# Patient Record
Sex: Male | Born: 1965 | Race: Black or African American | Hispanic: No | Marital: Single | Smoking: Current every day smoker
Health system: Southern US, Community
[De-identification: ages and names within clinical notes are randomized; demographics above are authoritative.]

## PROBLEM LIST (undated history)

## (undated) DIAGNOSIS — F1010 Alcohol abuse, uncomplicated: Secondary | ICD-10-CM

## (undated) DIAGNOSIS — I10 Essential (primary) hypertension: Secondary | ICD-10-CM

## (undated) DIAGNOSIS — I2510 Atherosclerotic heart disease of native coronary artery without angina pectoris: Secondary | ICD-10-CM

## (undated) DIAGNOSIS — I5042 Chronic combined systolic (congestive) and diastolic (congestive) heart failure: Secondary | ICD-10-CM

## (undated) DIAGNOSIS — F1490 Cocaine use, unspecified, uncomplicated: Secondary | ICD-10-CM

## (undated) HISTORY — DX: Essential (primary) hypertension: I10

## (undated) HISTORY — PX: EAR CYST EXCISION: SHX22

---

## 2016-07-21 ENCOUNTER — Encounter: Payer: Self-pay | Admitting: *Deleted

## 2016-07-21 ENCOUNTER — Other Ambulatory Visit: Payer: Self-pay | Admitting: *Deleted

## 2016-07-22 ENCOUNTER — Ambulatory Visit: Payer: Self-pay | Admitting: Cardiology

## 2016-08-12 ENCOUNTER — Encounter: Payer: Self-pay | Admitting: *Deleted

## 2016-08-12 ENCOUNTER — Ambulatory Visit (INDEPENDENT_AMBULATORY_CARE_PROVIDER_SITE_OTHER): Payer: Self-pay | Admitting: Cardiology

## 2016-08-12 ENCOUNTER — Encounter: Payer: Self-pay | Admitting: Cardiology

## 2016-08-12 VITALS — BP 136/80 | HR 73 | Ht 67.0 in | Wt 197.2 lb

## 2016-08-12 DIAGNOSIS — I429 Cardiomyopathy, unspecified: Secondary | ICD-10-CM

## 2016-08-12 DIAGNOSIS — I5022 Chronic systolic (congestive) heart failure: Secondary | ICD-10-CM

## 2016-08-12 NOTE — Progress Notes (Signed)
Clinical Summary Mr. Vlasic is a 50 y.o.male seen today as a new patient. He is referred by Dr Amanda Pea. He is a former patient of [place] [facility]   1. Chronic systolic HF - Q000111Q echo LVEF 25-30%, abnormal diastolic function - Q000111Q Lexiscan: inferobasilar to mid inferior gut attenuation vs inferior infarct, no ischemia.  - from Dr Dion Body notes plan was for trial of medical therapy, if LVEF not improved plan for cath at that time. - compliant with meds. Does not weight at home, though reports recent weight gain. From 04/2016 appt with Dr Hamilton Capri he is up 14 lbs.  - walks 2-4 miles 3 days a week without troubles.   - no orthopnea, no PND.  CHF risk factors: father with history of CHF, brother with "weak heart", reports previous heavy EtOH abuse but has now stopped, previous crack use but also stopped     Past Medical History:  Diagnosis Date  . CHF (congestive heart failure) ([place])   . Hypertension      No Known Allergies   Current Outpatient Prescriptions  Medication Sig Dispense Refill  . albuterol (PROVENTIL) (2.5 MG/3ML) 0.083% nebulizer solution Inhale 3 mLs into the lungs every 6 (six) hours as needed.    Marland Kitchen aspirin 81 MG chewable tablet Chew 1 tablet by mouth daily.    . carvedilol (COREG) 25 MG tablet Take 1 tablet by mouth 2 (two) times daily.    . furosemide (LASIX) 40 MG tablet Take 1 tablet by mouth daily.    . isosorbide mononitrate (IMDUR) 60 MG 24 hr tablet Take 1 tablet by mouth daily.    Marland Kitchen lisinopril (PRINIVIL,ZESTRIL) 40 MG tablet Take 1 tablet by mouth daily.    . potassium chloride SA (K-DUR,KLOR-CON) 20 MEQ tablet Take 1 tablet by mouth daily.     No current facility-administered medications for this visit.      No past surgical history on file.   No Known Allergies    Family History Father and brother with history of heart failure.    Social History Mr. Seaberry reports that he has been smoking.  He has never used  smokeless tobacco. Mr. Dearment reports previous heavy EtOH use   Review of Systems CONSTITUTIONAL: No weight loss, fever, chills, weakness or fatigue.  HEENT: Eyes: No visual loss, blurred vision, double vision or yellow sclerae.No hearing loss, sneezing, congestion, runny nose or sore throat.  SKIN: No rash or itching.  CARDIOVASCULAR: per HPI  RESPIRATORY: No shortness of breath, cough or sputum.  GASTROINTESTINAL: No anorexia, nausea, vomiting or diarrhea. No abdominal pain or blood.  GENITOURINARY: No burning on urination, no polyuria NEUROLOGICAL: No headache, dizziness, syncope, paralysis, ataxia, numbness or tingling in the extremities. No change in bowel or bladder control.  MUSCULOSKELETAL: No muscle, back pain, joint pain or stiffness.  LYMPHATICS: No enlarged nodes. No history of splenectomy.  PSYCHIATRIC: No history of depression or anxiety.  ENDOCRINOLOGIC: No reports of sweating, cold or heat intolerance. No polyuria or polydipsia.  Marland Kitchen   Physical Examination Vitals:   08/12/16 1335 08/12/16 1338  BP: 128/75 136/80  Pulse:  73   Filed Weights   08/12/16 1335  Weight: 197 lb 3.2 oz (89.4 kg)    Gen: resting comfortably, no acute distress HEENT: no scleral icterus, pupils equal round and reactive, no palptable cervical adenopathy,  CV: RRR, no m/r/g, no jvd Resp: Clear to auscultation bilaterally GI: abdomen is soft, non-tender, non-distended, normal bowel sounds,  no hepatosplenomegaly MSK: extremities are warm, no edema.  Skin: warm, no rash Neuro:  no focal deficits Psych: appropriate affect     Assessment and Plan  1. Chronic systolic HF - LVEF 123XX123, NYHA I - will repeat echo, if LVEF still low will require LHC/RHC - continue current meds. If continued low LVEF likely change to entresto, consider adding aldactone in the future - EKG in clinic shows SR  F/u pending echo results. Request pcp labs.       Arnoldo Lenis, M.D.

## 2016-08-12 NOTE — Patient Instructions (Signed)
Your physician recommends that you schedule a follow-up appointment PENDING TEST RESULTS   Your physician recommends that you continue on your current medications as directed. Please refer to the Current Medication list given to you today.  Your physician has requested that you have an echocardiogram. Echocardiography is a painless test that uses sound waves to create images of your heart. It provides your doctor with information about the size and shape of your heart and how well your heart's chambers and valves are working. This procedure takes approximately one hour. There are no restrictions for this procedure.  Thank you for choosing [facility]!!

## 2016-08-28 ENCOUNTER — Other Ambulatory Visit: Payer: Self-pay

## 2016-08-28 ENCOUNTER — Ambulatory Visit (INDEPENDENT_AMBULATORY_CARE_PROVIDER_SITE_OTHER): Payer: Self-pay

## 2016-08-28 DIAGNOSIS — I5022 Chronic systolic (congestive) heart failure: Secondary | ICD-10-CM

## 2016-09-02 ENCOUNTER — Telehealth: Payer: Self-pay | Admitting: *Deleted

## 2016-09-02 NOTE — Telephone Encounter (Signed)
Notes Recorded by Laurine Blazer, LPN on [id number] at 579FGE AM EDT Patient notified and verbalized understanding. Copy to pmd. Follow up scheduled for 10/14/2016 with Dr. Harl Bowie. ------  Notes Recorded by Arnoldo Lenis, MD on 08/29/2016 at 4:39 PM EDT Heart function has normalized, which is great news. He should see me back in 6 weeks to discuss in detail. NO further med changes indicated at this time

## 2016-10-14 ENCOUNTER — Encounter: Payer: Self-pay | Admitting: *Deleted

## 2016-10-14 ENCOUNTER — Encounter: Payer: Self-pay | Admitting: Cardiology

## 2016-10-14 ENCOUNTER — Ambulatory Visit (INDEPENDENT_AMBULATORY_CARE_PROVIDER_SITE_OTHER): Payer: Self-pay | Admitting: Cardiology

## 2016-10-14 VITALS — BP 161/93 | HR 65 | Ht 67.0 in | Wt 208.0 lb

## 2016-10-14 DIAGNOSIS — I5022 Chronic systolic (congestive) heart failure: Secondary | ICD-10-CM

## 2016-10-14 DIAGNOSIS — I10 Essential (primary) hypertension: Secondary | ICD-10-CM

## 2016-10-14 NOTE — Patient Instructions (Signed)

## 2016-10-14 NOTE — Progress Notes (Signed)
Clinical Summary Mr. Moskwa is a 50 y.o.male seen today for follow up of the following medical problems.   1. Chronic systolic HF - Q000111Q echo LVEF 25-30%, abnormal diastolic function - Q000111Q Lexiscan: inferobasilar to mid inferior gut attenuation vs inferior infarct, no ischemia.  - from Dr Dion Body notes plan was for trial of medical therapy, if LVEF not improved plan for cath at that time. - compliant with meds. Does not weight at home, though reports recent weight gain. From 04/2016 appt with Dr Hamilton Capri he is up 14 lbs.  - walks 2-4 miles 3 days a week without troubles.   - no orthopnea, no PND.  CHF risk factors: father with history of CHF, brother with "weak heart", reports previous heavy EtOH abuse but has now stopped, previous crack use but also stopped  - since last visit repeated echo 08/2016, LVEF at Q000111Q, grade I diastolic dysfunction.  - SOB a few days ago no resolved. No LE edema.   2. HTN - just took a meds prior to coming to clinic. Compliant with meds   Past Medical History:  Diagnosis Date  . CHF (congestive heart failure) ([place])   . Hypertension      No Known Allergies   Current Outpatient Prescriptions  Medication Sig Dispense Refill  . albuterol (PROVENTIL) (2.5 MG/3ML) 0.083% nebulizer solution Inhale 3 mLs into the lungs every 6 (six) hours as needed.    Marland Kitchen aspirin 81 MG chewable tablet Chew 1 tablet by mouth daily.    . carvedilol (COREG) 25 MG tablet Take 1 tablet by mouth 2 (two) times daily.    . furosemide (LASIX) 40 MG tablet Take 1 tablet by mouth daily.    . isosorbide mononitrate (IMDUR) 60 MG 24 hr tablet Take 1 tablet by mouth daily.    Marland Kitchen lisinopril (PRINIVIL,ZESTRIL) 40 MG tablet Take 1 tablet by mouth daily.    . potassium chloride SA (K-DUR,KLOR-CON) 20 MEQ tablet Take 1 tablet by mouth daily.     No current facility-administered medications for this visit.      No past surgical history on file.   No Known  Allergies    No family history on file.   Social History Mr. Bluemel reports that he has been smoking.  He has never used smokeless tobacco. Mr. Irons has no alcohol history on file.   Review of Systems CONSTITUTIONAL: No weight loss, fever, chills, weakness or fatigue.  HEENT: Eyes: No visual loss, blurred vision, double vision or yellow sclerae.No hearing loss, sneezing, congestion, runny nose or sore throat.  SKIN: No rash or itching.  CARDIOVASCULAR: per HPI RESPIRATORY: No shortness of breath, cough or sputum.  GASTROINTESTINAl: No anorexia, nausea, vomiting or diarrhea. No abdominal pain or blood.  GENITOURINARY: No burning on urination, no polyuria NEUROLOGICAL: No headache, dizziness, syncope, paralysis, ataxia, numbness or tingling in the extremities. No change in bowel or bladder control.  MUSCULOSKELETAL: No muscle, back pain, joint pain or stiffness.  LYMPHATICS: No enlarged nodes. No history of splenectomy.  PSYCHIATRIC: No history of depression or anxiety.  ENDOCRINOLOGIC: No reports of sweating, cold or heat intolerance. No polyuria or polydipsia.  Marland Kitchen   Physical Examination Vitals:   10/14/16 0858  BP: (!) 161/93  Pulse: 65   Vitals:   10/14/16 0858  Weight: 208 lb (94.3 kg)  Height: 5\' 7"  (1.702 m)    Gen: resting comfortably, no acute distress HEENT: no scleral icterus, pupils equal round and reactive, no palptable cervical  adenopathy,  CV: RRR, no m/r/g, no jvd Resp: Clear to auscultation bilaterally GI: abdomen is soft, non-tender, non-distended, normal bowel sounds, no hepatosplenomegaly MSK: extremities are warm, no edema.  Skin: warm, no rash Neuro:  no focal deficits Psych: appropriate affect   Diagnostic Studies 08/2016 echo Study Conclusions  - Left ventricle: The cavity size was normal. Wall thickness was   increased in a pattern of moderate LVH. Systolic function was   vigorous. The estimated ejection fraction was in the range of  65%   to 70%. Wall motion was normal; there were no regional wall   motion abnormalities. Doppler parameters are consistent with   abnormal left ventricular relaxation (grade 1 diastolic   dysfunction). - Aortic valve: Valve area (VTI): 3.2 cm^2. Valve area (Vmax): 2.89   cm^2. Valve area (Vmean): 2.95 cm^2. - Technically adequate study.    Assessment and Plan  1. Chronic systolic HF - LVEF 123XX123, NYHA I. Repeat echo shows LVEF has now normalized, LVEF 65-70% -no recent symptoms, continue current meds at this time  2. HTN - elevated in clinic, he has just taken his meds prior to coming. BP at last visit on current meds 136/80 - continue to monitor at this time.   F/u 6 months. Give list of local pcp's       Arnoldo Lenis, M.D.

## 2018-09-27 ENCOUNTER — Emergency Department (HOSPITAL_COMMUNITY): Payer: Medicare Other

## 2018-09-27 ENCOUNTER — Other Ambulatory Visit: Payer: Self-pay

## 2018-09-27 ENCOUNTER — Encounter (HOSPITAL_COMMUNITY): Payer: Self-pay

## 2018-09-27 ENCOUNTER — Inpatient Hospital Stay (HOSPITAL_COMMUNITY)
Admission: EM | Admit: 2018-09-27 | Discharge: 2018-09-29 | DRG: 281 | Disposition: A | Discharge status: Home / Self Care | Payer: Medicare Other | Attending: Internal Medicine | Admitting: Internal Medicine

## 2018-09-27 DIAGNOSIS — I5030 Unspecified diastolic (congestive) heart failure: Secondary | ICD-10-CM | POA: Diagnosis not present

## 2018-09-27 DIAGNOSIS — F1290 Cannabis use, unspecified, uncomplicated: Secondary | ICD-10-CM | POA: Diagnosis present

## 2018-09-27 DIAGNOSIS — Z720 Tobacco use: Secondary | ICD-10-CM | POA: Diagnosis not present

## 2018-09-27 DIAGNOSIS — I110 Hypertensive heart disease with heart failure: Secondary | ICD-10-CM | POA: Diagnosis present

## 2018-09-27 DIAGNOSIS — R7989 Other specified abnormal findings of blood chemistry: Secondary | ICD-10-CM

## 2018-09-27 DIAGNOSIS — I5042 Chronic combined systolic (congestive) and diastolic (congestive) heart failure: Secondary | ICD-10-CM | POA: Diagnosis present

## 2018-09-27 DIAGNOSIS — R079 Chest pain, unspecified: Secondary | ICD-10-CM

## 2018-09-27 DIAGNOSIS — Z79899 Other long term (current) drug therapy: Secondary | ICD-10-CM | POA: Diagnosis not present

## 2018-09-27 DIAGNOSIS — I213 ST elevation (STEMI) myocardial infarction of unspecified site: Secondary | ICD-10-CM | POA: Diagnosis not present

## 2018-09-27 DIAGNOSIS — I10 Essential (primary) hypertension: Secondary | ICD-10-CM | POA: Diagnosis present

## 2018-09-27 DIAGNOSIS — F17210 Nicotine dependence, cigarettes, uncomplicated: Secondary | ICD-10-CM | POA: Diagnosis not present

## 2018-09-27 DIAGNOSIS — R778 Other specified abnormalities of plasma proteins: Secondary | ICD-10-CM

## 2018-09-27 DIAGNOSIS — R0789 Other chest pain: Secondary | ICD-10-CM | POA: Diagnosis not present

## 2018-09-27 DIAGNOSIS — I509 Heart failure, unspecified: Secondary | ICD-10-CM | POA: Diagnosis not present

## 2018-09-27 DIAGNOSIS — I25111 Atherosclerotic heart disease of native coronary artery with angina pectoris with documented spasm: Secondary | ICD-10-CM | POA: Diagnosis present

## 2018-09-27 DIAGNOSIS — Z8249 Family history of ischemic heart disease and other diseases of the circulatory system: Secondary | ICD-10-CM | POA: Diagnosis not present

## 2018-09-27 DIAGNOSIS — Z9119 Patient's noncompliance with other medical treatment and regimen: Secondary | ICD-10-CM

## 2018-09-27 DIAGNOSIS — F1910 Other psychoactive substance abuse, uncomplicated: Secondary | ICD-10-CM | POA: Diagnosis not present

## 2018-09-27 DIAGNOSIS — I214 Non-ST elevation (NSTEMI) myocardial infarction: Secondary | ICD-10-CM | POA: Diagnosis not present

## 2018-09-27 DIAGNOSIS — F14188 Cocaine abuse with other cocaine-induced disorder: Secondary | ICD-10-CM | POA: Diagnosis present

## 2018-09-27 DIAGNOSIS — R0689 Other abnormalities of breathing: Secondary | ICD-10-CM | POA: Diagnosis not present

## 2018-09-27 DIAGNOSIS — Z7982 Long term (current) use of aspirin: Secondary | ICD-10-CM

## 2018-09-27 DIAGNOSIS — E7800 Pure hypercholesterolemia, unspecified: Secondary | ICD-10-CM | POA: Diagnosis not present

## 2018-09-27 HISTORY — DX: Chronic combined systolic (congestive) and diastolic (congestive) heart failure: I50.42

## 2018-09-27 HISTORY — DX: Cocaine use, unspecified, uncomplicated: F14.90

## 2018-09-27 HISTORY — DX: Atherosclerotic heart disease of native coronary artery without angina pectoris: I25.10

## 2018-09-27 HISTORY — DX: Alcohol abuse, uncomplicated: F10.10

## 2018-09-27 LAB — COMPREHENSIVE METABOLIC PANEL
ALK PHOS: 43 U/L (ref 38–126)
ALT: 21 U/L (ref 0–44)
ANION GAP: 8 (ref 5–15)
AST: 24 U/L (ref 15–41)
Albumin: 3.7 g/dL (ref 3.5–5.0)
BILIRUBIN TOTAL: 1 mg/dL (ref 0.3–1.2)
BUN: 13 mg/dL (ref 6–20)
CALCIUM: 9.5 mg/dL (ref 8.9–10.3)
CO2: 22 mmol/L (ref 22–32)
Chloride: 105 mmol/L (ref 98–111)
Creatinine, Ser: 1.1 mg/dL (ref 0.61–1.24)
GFR calc non Af Amer: >60 mL/min (ref >60)
Glucose, Bld: 132 mg/dL — ABNORMAL HIGH (ref 70–99)
POTASSIUM: 4.1 mmol/L (ref 3.5–5.1)
SODIUM: 135 mmol/L (ref 135–145)
TOTAL PROTEIN: 6.7 g/dL (ref 6.5–8.1)

## 2018-09-27 LAB — CBC
HCT: 38.7 % — ABNORMAL LOW (ref 39.0–52.0)
HEMOGLOBIN: 12.3 g/dL — AB (ref 13.0–17.0)
MCH: 30.1 pg (ref 26.0–34.0)
MCHC: 31.8 g/dL (ref 30.0–36.0)
MCV: 94.9 fL (ref 78.0–100.0)
Platelets: 241 10*3/uL (ref 150–400)
RBC: 4.08 MIL/uL — ABNORMAL LOW (ref 4.22–5.81)
RDW: 12 % (ref 11.5–15.5)
WBC: 8.1 10*3/uL (ref 4.0–10.5)

## 2018-09-27 LAB — I-STAT CHEM 8, ED
BUN: 16 mg/dL (ref 6–20)
CALCIUM ION: 1.27 mmol/L (ref 1.15–1.40)
CREATININE: 1 mg/dL (ref 0.61–1.24)
Chloride: 105 mmol/L (ref 98–111)
Glucose, Bld: 131 mg/dL — ABNORMAL HIGH (ref 70–99)
HCT: 39 % (ref 39.0–52.0)
Hemoglobin: 13.3 g/dL (ref 13.0–17.0)
Potassium: 4.1 mmol/L (ref 3.5–5.1)
Sodium: 136 mmol/L (ref 135–145)
TCO2: 23 mmol/L (ref 22–32)

## 2018-09-27 LAB — RAPID URINE DRUG SCREEN, HOSP PERFORMED
Amphetamines: NOT DETECTED
BARBITURATES: NOT DETECTED
Benzodiazepines: NOT DETECTED
Cocaine: POSITIVE — AB
Opiates: POSITIVE — AB
TETRAHYDROCANNABINOL: NOT DETECTED

## 2018-09-27 LAB — HEMOGLOBIN A1C
Hgb A1c MFr Bld: 5 % (ref 4.8–5.6)
Mean Plasma Glucose: 96.8 mg/dL

## 2018-09-27 LAB — HEPARIN LEVEL (UNFRACTIONATED): Heparin Unfractionated: 0.14 IU/mL — ABNORMAL LOW (ref 0.30–0.70)

## 2018-09-27 LAB — PROTIME-INR
INR: 1.04
Prothrombin Time: 13.6 seconds (ref 11.4–15.2)

## 2018-09-27 LAB — I-STAT TROPONIN, ED: TROPONIN I, POC: 0.39 ng/mL — AB (ref 0.00–0.08)

## 2018-09-27 LAB — TSH: TSH: 1.006 u[IU]/mL (ref 0.350–4.500)

## 2018-09-27 LAB — TROPONIN I: TROPONIN I: 3.5 ng/mL — AB (ref <0.03)

## 2018-09-27 LAB — MRSA PCR SCREENING: MRSA by PCR: NEGATIVE

## 2018-09-27 MED ORDER — HEPARIN BOLUS VIA INFUSION
2000.0000 [IU] | Freq: Once | INTRAVENOUS | Status: AC
  Administered 2018-09-27: 2000 [IU] via INTRAVENOUS
  Filled 2018-09-27: qty 2000

## 2018-09-27 MED ORDER — DILTIAZEM HCL 60 MG PO TABS
60.0000 mg | ORAL_TABLET | Freq: Four times a day (QID) | ORAL | Status: DC
  Administered 2018-09-27 – 2018-09-29 (×7): 60 mg via ORAL
  Filled 2018-09-27 (×7): qty 1

## 2018-09-27 MED ORDER — AMLODIPINE BESYLATE 10 MG PO TABS
10.0000 mg | ORAL_TABLET | Freq: Every day | ORAL | Status: DC
  Administered 2018-09-27 – 2018-09-29 (×3): 10 mg via ORAL
  Filled 2018-09-27 (×3): qty 1

## 2018-09-27 MED ORDER — ONDANSETRON HCL 4 MG/2ML IJ SOLN: 4.0000 mg | Freq: Four times a day (QID) | INTRAMUSCULAR | Status: DC | PRN

## 2018-09-27 MED ORDER — LISINOPRIL 40 MG PO TABS
40.0000 mg | ORAL_TABLET | Freq: Every day | ORAL | Status: DC
  Administered 2018-09-27 – 2018-09-29 (×3): 40 mg via ORAL
  Filled 2018-09-27 (×3): qty 1

## 2018-09-27 MED ORDER — ALBUTEROL SULFATE (2.5 MG/3ML) 0.083% IN NEBU: 2.5000 mg | INHALATION_SOLUTION | Freq: Four times a day (QID) | RESPIRATORY_TRACT | Status: DC | PRN

## 2018-09-27 MED ORDER — ISOSORBIDE MONONITRATE ER 60 MG PO TB24: 60.0000 mg | ORAL_TABLET | Freq: Every day | ORAL | Status: DC

## 2018-09-27 MED ORDER — SODIUM CHLORIDE 0.9 % IV SOLN: 250.0000 mL | INTRAVENOUS | Status: DC | PRN

## 2018-09-27 MED ORDER — ATORVASTATIN CALCIUM 40 MG PO TABS
40.0000 mg | ORAL_TABLET | Freq: Every day | ORAL | Status: DC
  Administered 2018-09-27 – 2018-09-29 (×3): 40 mg via ORAL
  Filled 2018-09-27 (×3): qty 1

## 2018-09-27 MED ORDER — SODIUM CHLORIDE 0.9% FLUSH: 3.0000 mL | INTRAVENOUS | Status: DC | PRN

## 2018-09-27 MED ORDER — ACETAMINOPHEN 325 MG PO TABS: 650.0000 mg | ORAL_TABLET | ORAL | Status: DC | PRN

## 2018-09-27 MED ORDER — ASPIRIN EC 81 MG PO TBEC
81.0000 mg | DELAYED_RELEASE_TABLET | Freq: Every day | ORAL | Status: DC
  Administered 2018-09-27 – 2018-09-29 (×3): 81 mg via ORAL
  Filled 2018-09-27 (×3): qty 1

## 2018-09-27 MED ORDER — SODIUM CHLORIDE 0.9% FLUSH
3.0000 mL | Freq: Two times a day (BID) | INTRAVENOUS | Status: DC
  Administered 2018-09-28 (×2): 3 mL via INTRAVENOUS

## 2018-09-27 MED ORDER — HEPARIN (PORCINE) IN NACL 100-0.45 UNIT/ML-% IJ SOLN
1250.0000 [IU]/h | INTRAMUSCULAR | Status: DC
  Administered 2018-09-27: 1000 [IU]/h via INTRAVENOUS
  Administered 2018-09-28: 1200 [IU]/h via INTRAVENOUS
  Administered 2018-09-29: 1250 [IU]/h via INTRAVENOUS
  Filled 2018-09-27 (×3): qty 250

## 2018-09-27 MED ORDER — NITROGLYCERIN 2 % TD OINT
1.0000 [in_us] | TOPICAL_OINTMENT | Freq: Four times a day (QID) | TRANSDERMAL | Status: DC
  Administered 2018-09-27 – 2018-09-29 (×7): 1 [in_us] via TOPICAL
  Filled 2018-09-27: qty 30

## 2018-09-27 NOTE — ED Provider Notes (Addendum)
[facility] EMERGENCY DEPARTMENT Provider Note   CSN: [id number] Arrival date & time: 09/27/18  1337     History   Chief Complaint Chief Complaint  Patient presents with  . Chest Pain    HPI Isaac Wilkerson is a 52 y.o. male.  HPI  52 year old man history of CHF, hypertension presents today as a code STEMI from [facility].  Code STEMI initiated by them and patient transfer initiated.  Apparently cardiology then got EKG from hospital and ems encode.   They felt that EKG from hospital was respiratory variation that account for the ST elevation and code STEMI was canceled.  Patient states that he was at work this morning when he began having some substernal chest pain that he identified as similar to heartburn.  He states that he ate  Tums without relief.  He felt somewhat lightheaded.  He sat down at a table that he felt very lightheaded and laid down.  At that time he told him to call an ambulance and taken to the hospital.  States that he did get sweaty at one point.  His pain has been relieved prior to evaluation here.  Is reported that he received aspirin, heparin, and morphine.  He is currently pain-free.  He states he has a history of CHF but has not had a prior myocardial infarction.  He reports he takes multiple medications for his high blood pressure.  He has a history of cocaine use and has used cocaine within the past week.  He also drinks up to 3 beers per day.  He is a smoker The hospital EKG from the ED at 1220 reviewed and shows normal sinus rhythm with wandering baseline in 2 3 and aVF which looks elevated and some beats but normalizes and others.  On EKG en route at 1258 from EMS shows a normal sinus rhythm with a heart rate of 33 with 1 PVC no evidence of ST elevation noted Past Medical History:  Diagnosis Date  . CHF (congestive heart failure) ([place])   . Hypertension     There are no active problems to display for this patient.   History reviewed.  No pertinent surgical history.      Home Medications    Prior to Admission medications   Medication Sig Start Date End Date Taking? Authorizing Provider  albuterol (PROVENTIL) (2.5 MG/3ML) 0.083% nebulizer solution Inhale 3 mLs into the lungs every 6 (six) hours as needed. 03/28/16   [provider]  carvedilol (COREG) 25 MG tablet Take 1 tablet by mouth 2 (two) times daily. 04/07/16 04/07/17  [provider]  furosemide (LASIX) 40 MG tablet Take 1 tablet by mouth daily. 04/01/16   [provider]  isosorbide mononitrate (IMDUR) 60 MG 24 hr tablet Take 1 tablet by mouth daily. 04/07/16 04/07/17  [provider]  lisinopril (PRINIVIL,ZESTRIL) 40 MG tablet Take 1 tablet by mouth daily. 04/07/16 04/07/17  [provider]  potassium chloride SA (K-DUR,KLOR-CON) 20 MEQ tablet Take 1 tablet by mouth daily. 03/28/16   [provider]    Family History History reviewed. No pertinent family history.  Social History Social History   Tobacco Use  . Smoking status: Former Smoker    Last attempt to quit: 10/12/2016    Years since quitting: 1.9  . Smokeless tobacco: Never Used  . Tobacco comment: takes a couple puffs of cigarette maybe 2 x week   Substance Use Topics  . Alcohol use: Not on file  .  Drug use: Not on file     Allergies   Patient has no known allergies.   Review of Systems Review of Systems  All other systems reviewed and are negative.    Physical Exam Updated Vital Signs BP 128/80 (BP Location: Right Arm)   Pulse (!) 59   Temp (!) 97.5 F (36.4 C) (Oral)   Resp 20   Ht 1.676 m (5\' 6" )   SpO2 98%   BMI 33.57 kg/m   Physical Exam  Constitutional: He appears well-developed and well-nourished.  HENT:  Head: Normocephalic and atraumatic.  Eyes: Pupils are equal, round, and reactive to light.  Neck: Normal range of motion. Neck supple.  Cardiovascular: Normal rate, regular rhythm and normal pulses.  Pulmonary/Chest:  Effort normal.  Abdominal: Soft. Bowel sounds are normal.  Musculoskeletal: Normal range of motion.       Right lower leg: Normal.       Left lower leg: Normal.  Neurological: He is alert.  Skin: Skin is warm and dry. Capillary refill takes less than 2 seconds.  Psychiatric: He has a normal mood and affect.  Nursing note and vitals reviewed.    ED Treatments / Results  Labs (all labs ordered are listed, but only abnormal results are displayed) Labs Reviewed - No data to display  EKG EKG Interpretation  Date/Time:  Monday September 27 2018 13:48:09 EDT Ventricular Rate:  63 PR Interval:    QRS Duration: 106 QT Interval:  407 QTC Calculation: 417 R Axis:   46 Text Interpretation:  Sinus rhythm Abnormal T, consider ischemia, lateral leads Confirmed by Pattricia Boss [phone]) on 09/27/2018 2:16:49 PM   Radiology No results found.  Procedures Procedures (including critical care time)  Medications Ordered in ED Medications - No data to display   Initial Impression / Assessment and Plan / ED Course  I have reviewed the triage vital signs and the nursing notes.  Pertinent labs & imaging results that were available during my care of the patient were reviewed by me and considered in my medical decision making (see chart for details).    1- chest pain- Patient with abnormal ekg, hear score of 5 2 cocaine use 3 hypertension 4 r near syncope  Cardiology has seen and consulted. Plan admission for further treatment and evaluation  CRITICAL CARE Performed by: Pattricia Boss Total critical care time: 45 minutes Critical care time was exclusive of separately billable procedures and treating other patients. Critical care was necessary to treat or prevent imminent or life-threatening deterioration. Critical care was time spent personally by me on the following activities: development of treatment plan with patient and/or surrogate as well as nursing, discussions with consultants,  evaluation of patient's response to treatment, examination of patient, obtaining history from patient or surrogate, ordering and performing treatments and interventions, ordering and review of laboratory studies, ordering and review of radiographic studies, pulse oximetry and re-evaluation of patient's condition.  Final Clinical Impressions(s) / ED Diagnoses   Final diagnoses:  NSTEMI (non-ST elevated myocardial infarction) [facility])    ED Discharge Orders    None       Pattricia Boss, MD 09/27/18 1558    Pattricia Boss, MD 10/11/18 1733

## 2018-09-27 NOTE — Progress Notes (Signed)
ANTICOAGULATION CONSULT NOTE - Initial Consult  Pharmacy Consult for heparin Indication: chest pain/ACS  No Known Allergies  Patient Measurements: Height: 5\' 6"  (167.6 cm) Weight: 183 lb (83 kg) IBW/kg (Calculated) : 63.8 Heparin Dosing Weight: 82.6 kg  Vital Signs: Temp: 98 F (36.7 C) (10/07 2035) Temp Source: Oral (10/07 2035) BP: 148/91 (10/07 2035) Pulse Rate: 77 (10/07 2035)  Labs: Recent Labs    09/27/18 1427 09/27/18 1440 09/27/18 1944  HGB 12.3* 13.3  --   HCT 38.7* 39.0  --   PLT 241  --   --   LABPROT 13.6  --   --   INR 1.04  --   --   HEPARINUNFRC  --   --  0.14*  CREATININE 1.10 1.00  --   TROPONINI  --   --  3.50*    Estimated Creatinine Clearance: 86.4 mL/min (by C-G formula based on SCr of 1 mg/dL).  Assessment: CC/HPI: 52 yo m presenting with CP from [facility]  PMH: cocaine etoh CHF HTN  Anticoag: none pta - tx with IV hep but no pump so unsure dose.    Renal: SCr 1   Heme/Onc: H&H 13.3/39, Plt 241  Heparin level came back low tonight. We will give a small bolus then increase rate. Level in AM.   Goal of Therapy:  Heparin level 0.3-0.7 units/ml Monitor platelets by anticoagulation protocol: Yes   Plan:  Heparin bolus 2000 units x1 Increase heparin 1200 units/hr HL in AM  Onnie Boer, PharmD, [place], AAHIVP, CPP Infectious Disease Pharmacist Pager: [phone] 09/27/2018 10:06 PM

## 2018-09-27 NOTE — Progress Notes (Signed)
Discussed need for 18ga PIV CT for tomorrow.   Plan to insert in am.

## 2018-09-27 NOTE — H&P (Signed)
History and Physical    Isaac Wilkerson BJS:[id number] DOB: 03/10/1966 DOA: 09/27/2018  PCP: Neale Burly, MD Consultants:  None Patient coming from:  Home - lives with girlfriend; NOK: Mother, [phone]  Chief Complaint: Chest pain  HPI: Isaac Wilkerson is a 52 y.o. male with medical history significant of HTN; polysubstance abuse (ETOH, cocaine); and chronic combined CHF presenting with chest pain. He was at work this AM.  He ate a sandwich and thought it was GERD.  About 930 he took tums and it got worse.  He sat outside and was sweating.  He went back to work and couldn't do it anymore.  A coworker took him to the ER.  He had substernal chest pain. He was exerting himself, cooking.  He was given NTG at [facility] and the pain resolved.  Last use of cocaine was about 1-2 days ago, uses occasionally.  He is having CP now, recently restarted.  He does have a h/o CAD requiring cath but no stent at that time.   ED Course:   Cardiology accepted this patient as a STEMI from [facility].  Patient arrived pain-free and current EKG does not look like a STEMI.  First EKG thought maybe a wandering baseline rather than STEMI.  Pain-free.  Recent cocaine use.  On Heparin drip.    Review of Systems: As per HPI; otherwise review of systems reviewed and negative.   Ambulatory Status:  Ambulates without assistance  Past Medical History:  Diagnosis Date  . Alcohol abuse   . CAD (coronary artery disease)   . Chronic combined systolic and diastolic CHF (congestive heart failure) ([place])   . Cocaine use   . Hypertension     Past Surgical History:  Procedure Laterality Date  . EAR CYST EXCISION      Social History   Socioeconomic History  . Marital status: Unknown    Spouse name: Not on file  . Number of children: Not on file  . Years of education: Not on file  . Highest education level: Not on file  Occupational History  . Occupation: Training and development officer  Social Needs  . Financial resource strain: Not on file  .  Food insecurity:    Worry: Not on file    Inability: Not on file  . Transportation needs:    Medical: Not on file    Non-medical: Not on file  Tobacco Use  . Smoking status: Current Every Day Smoker    Packs/day: 0.50    Years: 40.00    Pack years: 20.00    Last attempt to quit: 10/12/2016    Years since quitting: 1.9  . Smokeless tobacco: Never Used  . Tobacco comment: Only smokes when he drinks  Substance and Sexual Activity  . Alcohol use: Yes    Alcohol/week: 3.0 standard drinks    Types: 3 Cans of beer per week    Comment: Previously listed as heavy, currently 3 beers 3x/week  . Drug use: Yes    Types: Cocaine, Marijuana    Comment: every so often  . Sexual activity: Not on file  Lifestyle  . Physical activity:    Days per week: Not on file    Minutes per session: Not on file  . Stress: Not on file  Relationships  . Social connections:    Talks on phone: Not on file    Gets together: Not on file    Attends religious service: Not on file    Active member of club or organization: Not  on file    Attends meetings of clubs or organizations: Not on file    Relationship status: Not on file  . Intimate partner violence:    Fear of current or ex partner: Not on file    Emotionally abused: Not on file    Physically abused: Not on file    Forced sexual activity: Not on file  Other Topics Concern  . Not on file  Social History Narrative  . Not on file    No Known Allergies  Family History  Problem Relation Age of Onset  . Congestive Heart Failure Father   . CAD Father 3  . Cardiomyopathy Brother   . CAD Brother 53  . CAD Mother 22    Prior to Admission medications   Medication Sig Start Date End Date Taking? Authorizing Provider  albuterol (PROVENTIL HFA;VENTOLIN HFA) 108 (90 Base) MCG/ACT inhaler Inhale 1-2 puffs into the lungs every 6 (six) hours as needed for wheezing or shortness of breath.   Yes [provider]  albuterol (PROVENTIL) (2.5 MG/3ML)  0.083% nebulizer solution Inhale 2.5 mg into the lungs every 6 (six) hours as needed for wheezing or shortness of breath.  03/28/16  Yes [provider]  amLODipine (NORVASC) 10 MG tablet Take 10 mg by mouth daily.   Yes [provider]  aspirin EC 81 MG tablet Take 81 mg by mouth daily.   Yes [provider]  furosemide (LASIX) 40 MG tablet Take 40 mg by mouth daily.  04/01/16  Yes [provider]  isosorbide mononitrate (IMDUR) 60 MG 24 hr tablet Take 60 mg by mouth daily.  04/07/16 09/27/18 Yes [provider]  lisinopril (PRINIVIL,ZESTRIL) 40 MG tablet Take 40 mg by mouth daily.  04/07/16 09/27/18 Yes [provider]  potassium chloride SA (K-DUR,KLOR-CON) 20 MEQ tablet Take 20 mEq by mouth daily.  03/28/16  Yes [provider]  pravastatin (PRAVACHOL) 40 MG tablet Take 40 mg by mouth every evening. 09/05/18  Yes [provider]    Physical Exam: Vitals:   09/27/18 1606 09/27/18 1630 09/27/18 1700 09/27/18 1716  BP: (!) 162/108 (!) 166/98  (!) 169/91  Pulse: 64 70  62  Resp: 18 16  16   Temp:   97.7 F (36.5 C)   TempSrc:   Oral   SpO2: 96% 97%  100%  Weight:   83 kg   Height:   5\' 6"  (1.676 m)      General:  Appears calm and comfortable and is NAD Eyes:  PERRL, EOMI, normal lids, iris ENT:  grossly normal hearing, lips & tongue, mmm; poor dentition Neck:  no LAD, masses or thyromegaly; no carotid bruits Cardiovascular:  RRR, no m/r/g. No LE edema.  Respiratory:   CTA bilaterally with no wheezes/rales/rhonchi.  Normal respiratory effort. Abdomen:  soft, NT, ND, NABS Back:   normal alignment, no CVAT Skin:  no rash or induration seen on limited exam Musculoskeletal:  grossly normal tone BUE/BLE, good ROM, no bony abnormality Psychiatric:  grossly normal mood and affect, speech fluent and appropriate, AOx3 Neurologic:  CN 2-12 grossly intact, moves all extremities in coordinated fashion, sensation  intact    Radiological Exams on Admission: Dg Chest Port 1 View  Result Date: 09/27/2018 CLINICAL DATA:  Chest pain EXAM: PORTABLE CHEST 1 VIEW COMPARISON:  None. FINDINGS: The heart size and mediastinal contours are within normal limits. Both lungs are clear. Cardiac monitoring and defibrillator paddles project over the thorax. The visualized skeletal structures  are unremarkable. IMPRESSION: No active disease. Electronically Signed   By: Ashley Royalty M.D.   On: 09/27/2018 14:25    EKG: Independently reviewed.   Initial with wandering baseline in the inferior leads and J point elevation in V3 In our ER at 1348 - NSR with rate 63; nonspecific ST changes with no evidence of acute ischemia   Labs on Admission: I have personally reviewed the available labs and imaging studies at the time of the admission.  Pertinent labs:   Glucose 132 CMP otherwise WNL Hgb 12.3 CBC otherwise essentially normal Troponin 0.39 INR 1.04  Assessment/Plan Principal Problem:   NSTEMI (non-ST elevated myocardial infarction) ([place]) Active Problems:   Essential hypertension   Chronic combined systolic (congestive) and diastolic (congestive) heart failure (HCC)   Tobacco abuse   Polysubstance abuse ([place])   NSTEMI -Patient presenting with acute exertional, substernal CP that occurred this AM while he was at work, relieved with NTG -Initial EKG with baseline wander; initial interpretation was concerning for STEMI but this appears to not have been the case; instead, this is now likely NSTEMI vs. Cocaine-induced coronary vasospasm -CXR unremarkable.    -Will plan to admit to [facility]U [facility] on telemetry to further evaluate for ACS.  -Patient discussed with cardiology; Dr. Recardo Evangelist has seen the patient and beautifully counseled him about the effects of cocaine on the heart -cycle troponin q6h x 3 and repeat EKG in AM -ASA 81 mg PO daily -morphine given -Start Lipitor 40 mg qhs for now (substitute for  pravastatin) -Risk factor stratification with FLP and HgbA1c; will also check TSH and UDS -Further ischemic evaluation with coronary CTA tomorrow -He also needs an Echo tomorrow  HTN -Continue home medications - Norvasc, Lisinopril -No beta blocker given ongoing cocaine use  H/o heart failure -Echo in 4/17 showed dilated cardiomyopathy with EF 25-30% -Echo in 9/17 showed preserved EF with grade 1 diastolic dysfunction -This discrepancy appears to correlate with patient's active use of cocaine (was using in 4/17 but not 9/17) -Will obtain echo, but currently appears compensated from a heart failure standpoint at this time -Avoidance of cocaine was strongly encouraged  Polysubstance abuse -Cessation encouraged; this should be encouraged on an ongoing basis -UDS ordered  Tobacco dependence -Encourage cessation.  This was discussed with the patient and should be reviewed on an ongoing basis.  -Patch declined by patient.  DVT prophylaxis: Heparin drip Code Status:  Full - confirmed with patient/family Family Communication: Sister and brother-in-law were present throughout evaluation  Disposition Plan:  Home once clinically improved Consults called: Cardiology  Admission status: Admit - It is my clinical opinion that admission to INPATIENT is reasonable and necessary because of the expectation that this patient will require hospital care that crosses at least 2 midnights to treat this condition based on the medical complexity of the problems presented.  Given the aforementioned information, the predictability of an adverse outcome is felt to be significant.   Karmen Bongo MD [facility]  If note is complete, please contact covering daytime or nighttime physician. [email or web address] Password [email or web address]  09/27/2018, 5:51 PM

## 2018-09-27 NOTE — Progress Notes (Signed)
ANTICOAGULATION CONSULT NOTE - Initial Consult  Pharmacy Consult for heparin Indication: chest pain/ACS  No Known Allergies  Patient Measurements: Height: 5\' 6"  (167.6 cm) Weight: 182 lb (82.6 kg) IBW/kg (Calculated) : 63.8 Heparin Dosing Weight: 82.6 kg  Vital Signs: Temp: 97.5 F (36.4 C) (10/07 1346) Temp Source: Oral (10/07 1346) BP: 157/107 (10/07 1534) Pulse Rate: 66 (10/07 1535)  Labs: Recent Labs    09/27/18 1427 09/27/18 1440  HGB 12.3* 13.3  HCT 38.7* 39.0  PLT 241  --   LABPROT 13.6  --   INR 1.04  --   CREATININE 1.10 1.00    Estimated Creatinine Clearance: 86.2 mL/min (by C-G formula based on SCr of 1 mg/dL).  Assessment: CC/HPI: 52 yo m presenting with CP from [facility]  PMH: cocaine etoh CHF HTN  Anticoag: none pta - tx with IV hep but no pump so unsure dose.    Renal: SCr 1   Heme/Onc: H&H 13.3/39, Plt 241  Goal of Therapy:  Heparin level 0.3-0.7 units/ml Monitor platelets by anticoagulation protocol: Yes   Plan:  Heparin 1000 units/hr Initial Lvl 2000 Daily HL CBC F/U cards plans  Levester Fresh, PharmD, BCPS, BCCCP Clinical Pharmacist [phone]  Please check AMION for all [place] numbers  09/27/2018 3:55 PM

## 2018-09-27 NOTE — Progress Notes (Signed)
Troponin level 3.50. Cardiologist on call notified no new order.

## 2018-09-27 NOTE — Consult Note (Addendum)
Cardiology Consultation:   Patient ID: Isaac Wilkerson; [id number]; 1966/11/26   Admit date: 09/27/2018 Date of Consult: 09/27/2018  Primary Care Provider: Neale Burly, MD Primary Cardiologist: Carlyle Dolly, MD Primary Electrophysiologist:  None  Chief Complaint: chest pain (currently states "I want to go home")  Patient Profile:   Isaac Wilkerson is a 52 y.o. male with a hx of chronic combined CHF with improved EF, alcohol/cocaine use, HTN who is being seen today for the evaluation of elevated troponin at the request of Dr. Jeanell Sparrow.  History of Present Illness:   Isaac Wilkerson was previously followed by [facility] and had echo in 03/2016 with EF 25-30% with abnormal diastolic function. 03/2016 Lexiscan showed inferobasilar to mid inferior gut attenuation vs inferior infarct, no ischemia. Plan was to follow EF and consider cath if not improved. When he established care with Dr. Harl Bowie, echo 08/2016 was done which showed EF 65-70% with grade 1 DD. He was supposed to follow up in 6 months but never did. He cites he didn't know was supposed to.  He walks his pitbull daily and denies any recent anginal symptoms. He was at work today and developed onset of chest pain that worsened over several hours' time, persistent in nature. He took Tums thinking it was indigestion but symptoms got worse. He also had diaphoresis and near-syncope. He had to sit down. He had never had pain like this before. It was somewhat worse with specific movements or palpation. Due to persistent sx he went to [facility] where he was initially felt to have a code STEMI and he was transferred to [facility] emergently however it sounds like after EKG was reviewed by cardiology here they cancelled code STEMI. He received 324mg  ASA, 5mg  of morphine and was started on heparin. The patient indicates pain resolved when he got IV medicine at [facility]. He is currently pain free and wants to go home. His brother-in-law and sister are in  the room. He told the ER doctor he used cocaine within the last week but denies use while his family is in the room. He reports drinking 3 12oz beers 3x/ week and does use THC. Upon arrival he is hemodynamically stable (BP slightly elevated at times). Labs reveal glucose of 132, Cr 1.10, LFTS ok, POC troponin 0.39. CXR NAD. He has family history of CHF in father and brother. He denies any recent sx of volume overload such as LEE or orthopnea.  At prior OV in 2017 he was on lisinopril, carvedilol, Lasix and Imdur. He is no longer on carvedilol per list but is on amlodipine.   Past Medical History:  Diagnosis Date  . Alcohol abuse   . Chronic combined systolic and diastolic CHF (congestive heart failure) ([place])   . Cocaine use   . Hypertension     History reviewed. No pertinent surgical history.   Inpatient Medications: Scheduled Meds:  Continuous Infusions: . heparin 1,000 Units/hr (09/27/18 1532)   PRN Meds:   Home Meds: Prior to Admission medications   Medication Sig Start Date End Date Taking? Authorizing Provider  albuterol (PROVENTIL HFA;VENTOLIN HFA) 108 (90 Base) MCG/ACT inhaler Inhale 1-2 puffs into the lungs every 6 (six) hours as needed for wheezing or shortness of breath.   Yes [provider]  albuterol (PROVENTIL) (2.5 MG/3ML) 0.083% nebulizer solution Inhale 2.5 mg into the lungs every 6 (six) hours as needed for wheezing or shortness of breath.  03/28/16  Yes [provider]  amLODipine (NORVASC) 10 MG tablet Take  10 mg by mouth daily.   Yes [provider]  aspirin EC 81 MG tablet Take 81 mg by mouth daily.   Yes [provider]  furosemide (LASIX) 40 MG tablet Take 40 mg by mouth daily.  04/01/16  Yes [provider]  isosorbide mononitrate (IMDUR) 60 MG 24 hr tablet Take 60 mg by mouth daily.  04/07/16 09/27/18 Yes [provider]  lisinopril (PRINIVIL,ZESTRIL) 40 MG tablet Take 40 mg by mouth daily.  04/07/16 09/27/18  Yes [provider]  potassium chloride SA (K-DUR,KLOR-CON) 20 MEQ tablet Take 20 mEq by mouth daily.  03/28/16  Yes [provider]  pravastatin (PRAVACHOL) 40 MG tablet Take 40 mg by mouth every evening. 09/05/18  Yes [provider]    Allergies:   No Known Allergies  Social History:   Social History   Socioeconomic History  . Marital status: Unknown    Spouse name: Not on file  . Number of children: Not on file  . Years of education: Not on file  . Highest education level: Not on file  Occupational History  . Not on file  Social Needs  . Financial resource strain: Not on file  . Food insecurity:    Worry: Not on file    Inability: Not on file  . Transportation needs:    Medical: Not on file    Non-medical: Not on file  Tobacco Use  . Smoking status: Former Smoker    Last attempt to quit: 10/12/2016    Years since quitting: 1.9  . Smokeless tobacco: Never Used  . Tobacco comment: Only smokes when he drinks - not every day, approx 3x/week  Substance and Sexual Activity  . Alcohol use: Yes    Comment: Previously listed as heavy, currently 3 beers 3x/week  . Drug use: Not Currently    Comment: Pt reported cocaine to EDP but denied to cardiology  . Sexual activity: Not on file  Lifestyle  . Physical activity:    Days per week: Not on file    Minutes per session: Not on file  . Stress: Not on file  Relationships  . Social connections:    Talks on phone: Not on file    Gets together: Not on file    Attends religious service: Not on file    Active member of club or organization: Not on file    Attends meetings of clubs or organizations: Not on file    Relationship status: Not on file  . Intimate partner violence:    Fear of current or ex partner: Not on file    Emotionally abused: Not on file    Physically abused: Not on file    Forced sexual activity: Not on file  Other Topics Concern  . Not on file  Social History Narrative  . Not on  file    Family History:   The patient's family history includes Cardiomyopathy in his brother; Congestive Heart Failure in his father.  ROS:  Please see the history of present illness.  All other ROS reviewed and negative.     Physical Exam/Data:   Vitals:   09/27/18 1500 09/27/18 1534 09/27/18 1535 09/27/18 1536  BP: 133/83 (!) 157/107    Pulse: 64  66   Resp: 15 18 12    Temp:      TempSrc:      SpO2: 95%  99%   Weight:    82.6 kg  Height:    5\' 6"  (1.676  m)   No intake or output data in the 24 hours ending 09/27/18 1605 Filed Weights   09/27/18 1536  Weight: 82.6 kg   Body mass index is 29.38 kg/m.  General: Well developed, well nourished AAM, in no acute distress. Head: Normocephalic, atraumatic, sclera non-icteric, no xanthomas, nares are without discharge. Poor dentition Neck: Negative for carotid bruits. JVD not elevated. Lungs: Clear bilaterally to auscultation without wheezes, rales, or rhonchi. Breathing is unlabored. Heart: RRR with S1 S2. No murmurs, rubs, or gallops appreciated. Abdomen: Soft, non-tender, non-distended with normoactive bowel sounds. No hepatomegaly. No rebound/guarding. No obvious abdominal masses. Msk:  Strength and tone appear normal for age. Extremities: No clubbing or cyanosis. No edema.  Distal pedal pulses are 2+ and equal bilaterally. Neuro: Alert and oriented X 3. No facial asymmetry. No focal deficit. Moves all extremities spontaneously. Psych:  Responds to questions appropriately with a normal affect.  EKG:  The EKG was personally reviewed and demonstrates NSR 63pm, nonspecific ST-T changes  Relevant CV Studies: Referenced above.   Laboratory Data:  Chemistry Recent Labs  Lab 09/27/18 1427 09/27/18 1440  NA 135 136  K 4.1 4.1  CL 105 105  CO2 22  --   GLUCOSE 132* 131*  BUN 13 16  CREATININE 1.10 1.00  CALCIUM 9.5  --   GFRNONAA >60  --   GFRAA >60  --   ANIONGAP 8  --     Recent Labs  Lab 09/27/18 1427  PROT  6.7  ALBUMIN 3.7  AST 24  ALT 21  ALKPHOS 43  BILITOT 1.0   Hematology Recent Labs  Lab 09/27/18 1427 09/27/18 1440  WBC 8.1  --   RBC 4.08*  --   HGB 12.3* 13.3  HCT 38.7* 39.0  MCV 94.9  --   MCH 30.1  --   MCHC 31.8  --   RDW 12.0  --   PLT 241  --    Cardiac EnzymesNo results for input(s): TROPONINI in the last 168 hours.  Recent Labs  Lab 09/27/18 1438  TROPIPOC 0.39*    BNPNo results for input(s): BNP, PROBNP in the last 168 hours.  DDimer No results for input(s): DDIMER in the last 168 hours.  Radiology/Studies:  Dg Chest Port 1 View  Result Date: 09/27/2018 CLINICAL DATA:  Chest pain EXAM: PORTABLE CHEST 1 VIEW COMPARISON:  None. FINDINGS: The heart size and mediastinal contours are within normal limits. Both lungs are clear. Cardiac monitoring and defibrillator paddles project over the thorax. The visualized skeletal structures are unremarkable. IMPRESSION: No active disease. Electronically Signed   By: Ashley Royalty M.D.   On: 09/27/2018 14:25    Assessment and Plan:   1. Chest pain/elevated troponin - no prior anginal symptoms. He had fairly abrupt onset at work. This raises concern for cocaine induced vasospasm but cannot exclude underlying ACS. Patient initially resistant to idea of admission but willing to stay. Would not jump straight to cath given clinical scenario, also concern for outpatient f/u. Per d/w Dr. Sallyanne Kuster, plan cardiac CTA in AM. I contacted Dr. Aundra Dubin who agreed to read the study. Order placed for 18g antecubital IV. Does not need to be NPO for study. HR is anywhere from mid 60s-80s in the ER - would not use beta blocker for HR. We will use diltiazem 60mg  oral q6hours to achieve HR closer to the 50s for adequate CT images. Will also update echo. OK to continue ASA, heparin, statin for now. Check lipids in  AM.  2. Polysubstance abuse - patient reported cocaine use to ER but denied to me. Check UDS. Consider CIWA precautions as well for h/o  ETOH.  3. HTN - BP initially normal but creeping up. Follow on home regimen (consider holding amlodipine while initiated on diltiazem which will likely only be a short-term med for the cardiac CT).  4. H/o chronic combined CHF - last echo 08/2016 showed normal LVEF. Etiology of cardiomyopathy not clear, but Lexiscan 03/2016 was nonischemic. This raises suspicion of relationship to prior substance abuse. Update echo to eval for new drop in EF.  For questions or updates, please contact [place] Please consult [email or web address] for contact info under Cardiology/STEMI.    Signed, Charlie Pitter, PA-C  09/27/2018 4:05 PM  I have seen and examined the patient along with Charlie Pitter, PA-C .  I have reviewed the chart, notes and new data.  I agree with PA's note.  Key new complaints: currently asymptomatic. There seems to be a more than coincidental connection between his use of cocaine the degree of heart problems he has. He did better 2 years ago not only because he wwas taking meds, but also avoiding cocaine. Key examination changes: severely elevated BP, S4 present, no murmurs or rubs, excellent distal pulses Key new findings / data: ECG with new anterior T wave inversion, chronic lateral ST-T changes. Mildly elevated troponin.  PLAN: Cocaine-induced coronary vasospasm +/- underlying fixed CAD.  Check echo and coronary CTA. No reason for immediate invasive coronary angio at this time. Will try to reduce HR for CTA with diltiazem rather than beta blocker.    Sanda Klein, MD, [place] [phone] 09/27/2018, 5:20 PM

## 2018-09-27 NOTE — ED Triage Notes (Signed)
Pt transported from [facility] with complaint of chest pain. CP began around 0900. Pt had 324 aspirin, 5mg  of morphine, with heparin drip running, he also had 4000u bolus.   64 98% 165/78 15rr

## 2018-09-28 ENCOUNTER — Inpatient Hospital Stay (HOSPITAL_COMMUNITY): Payer: Medicare Other

## 2018-09-28 DIAGNOSIS — F1910 Other psychoactive substance abuse, uncomplicated: Secondary | ICD-10-CM

## 2018-09-28 DIAGNOSIS — I5030 Unspecified diastolic (congestive) heart failure: Secondary | ICD-10-CM

## 2018-09-28 DIAGNOSIS — Z720 Tobacco use: Secondary | ICD-10-CM

## 2018-09-28 DIAGNOSIS — I10 Essential (primary) hypertension: Secondary | ICD-10-CM

## 2018-09-28 DIAGNOSIS — R7989 Other specified abnormal findings of blood chemistry: Secondary | ICD-10-CM

## 2018-09-28 LAB — HEPARIN LEVEL (UNFRACTIONATED)
HEPARIN UNFRACTIONATED: 0.37 [IU]/mL (ref 0.30–0.70)
Heparin Unfractionated: 0.34 IU/mL (ref 0.30–0.70)

## 2018-09-28 LAB — BASIC METABOLIC PANEL
Anion gap: 9 (ref 5–15)
BUN: 9 mg/dL (ref 6–20)
CHLORIDE: 104 mmol/L (ref 98–111)
CO2: 25 mmol/L (ref 22–32)
CREATININE: 1.11 mg/dL (ref 0.61–1.24)
Calcium: 9.1 mg/dL (ref 8.9–10.3)
Glucose, Bld: 110 mg/dL — ABNORMAL HIGH (ref 70–99)
Potassium: 4.2 mmol/L (ref 3.5–5.1)
Sodium: 138 mmol/L (ref 135–145)

## 2018-09-28 LAB — CBC
HEMATOCRIT: 40.8 % (ref 39.0–52.0)
Hemoglobin: 12.8 g/dL — ABNORMAL LOW (ref 13.0–17.0)
MCH: 30.3 pg (ref 26.0–34.0)
MCHC: 31.4 g/dL (ref 30.0–36.0)
MCV: 96.5 fL (ref 80.0–100.0)
Platelets: 249 10*3/uL (ref 150–400)
RBC: 4.23 MIL/uL (ref 4.22–5.81)
RDW: 12 % (ref 11.5–15.5)
WBC: 7.4 10*3/uL (ref 4.0–10.5)

## 2018-09-28 LAB — LIPID PANEL
Cholesterol: 182 mg/dL (ref 0–200)
HDL: 51 mg/dL (ref >40)
LDL CALC: 77 mg/dL (ref 0–99)
Total CHOL/HDL Ratio: 3.6 RATIO
Triglycerides: 269 mg/dL — ABNORMAL HIGH (ref <150)
VLDL: 54 mg/dL — AB (ref 0–40)

## 2018-09-28 LAB — PROTIME-INR
INR: 0.99
Prothrombin Time: 13 seconds (ref 11.4–15.2)

## 2018-09-28 LAB — HIV ANTIBODY (ROUTINE TESTING W REFLEX): HIV SCREEN 4TH GENERATION: NONREACTIVE

## 2018-09-28 LAB — ECHOCARDIOGRAM COMPLETE
Height: 66 in
WEIGHTICAEL: 2958.4 [oz_av]

## 2018-09-28 LAB — TROPONIN I
Troponin I: 4.74 ng/mL (ref <0.03)
Troponin I: 3.19 ng/mL (ref <0.03)

## 2018-09-28 MED ORDER — IOPAMIDOL (ISOVUE-370) INJECTION 76%
100.0000 mL | Freq: Once | INTRAVENOUS | Status: AC | PRN
  Administered 2018-09-28: 100 mL via INTRAVENOUS

## 2018-09-28 MED ORDER — DILTIAZEM HCL 25 MG/5ML IV SOLN
10.0000 mg | Freq: Once | INTRAVENOUS | Status: DC
  Filled 2018-09-28: qty 5

## 2018-09-28 MED ORDER — ALBUTEROL SULFATE (2.5 MG/3ML) 0.083% IN NEBU: 2.5000 mg | INHALATION_SOLUTION | RESPIRATORY_TRACT | Status: DC | PRN

## 2018-09-28 MED ORDER — NITROGLYCERIN 0.4 MG SL SUBL: 0.4000 mg | SUBLINGUAL_TABLET | Freq: Once | SUBLINGUAL | Status: DC

## 2018-09-28 NOTE — Progress Notes (Signed)
ANTICOAGULATION CONSULT NOTE - Follow Up Consult  Pharmacy Consult for heparin Indication: chest pain/ACS  Labs: Recent Labs    09/27/18 1427 09/27/18 1440 09/27/18 1944 09/28/18 0313  HGB 12.3* 13.3  --   --   HCT 38.7* 39.0  --   --   PLT 241  --   --   --   LABPROT 13.6  --   --   --   INR 1.04  --   --   --   HEPARINUNFRC  --   --  0.14* 0.34  CREATININE 1.10 1.00  --   --   TROPONINI  --   --  3.50* 4.74*    Assessment.Plan:  52yo male therapeutic on heparin after rate change. Will continue gtt at current rate and confirm stable with additional level.   Wynona Neat, PharmD, BCPS  09/28/2018,4:34 AM

## 2018-09-28 NOTE — Progress Notes (Signed)
[facility] TEAM 1 - Stepdown/ICU TEAM  Devansh Riese  HYW:[id number] DOB: [birth year] DOA: 09/27/2018 PCP: Neale Burly, MD    Brief Narrative:  52 y.o. male with a hx of HTN; polysubstance abuse (ETOH, cocaine); and chronic combined CHF who presented with chest pain. Last use of cocaine was 1-2 days prior.  Subjective: Resting comfortably in bed at this time. Denies sob, abdom pain, n/v. States his CP has resolved at this time.   Assessment & Plan:  Chest pain - Elevated troponin  Extent of further w/u to be determined by TTE results - Cards following and directing care   HTN Follow BP - may require adjustment in med dosing - avoiding BB in setting of cocaine use  Hx of Chronic systolic CHF EF reported as 25-30% in Apr 2017 at [facility] - EF normalized by echo Sept 2017 - no signif volume overload on physical exam   Polysubstance abuse - cocaine abuse  Cont to counsel on absolute need to avoid cocaine   Tobacco abuse  Counsel on need to abstain from smoking   DVT prophylaxis: IV heparin  Code Status: FULL CODE Family Communication: Disposition Plan: SDU  Consultants:  [facility]   Antimicrobials:  none  Objective: Blood pressure (!) 137/95, pulse (!) 58, temperature 97.7 F (36.5 C), temperature source Oral, resp. rate 16, height 5\' 6"  (1.676 m), weight 83.9 kg, SpO2 99 %.  Intake/Output Summary (Last 24 hours) at 09/28/2018 1437 Last data filed at 09/28/2018 1209 Gross per 24 hour  Intake 1104.32 ml  Output 450 ml  Net 654.32 ml   Filed Weights   09/27/18 1536 09/27/18 1700 09/28/18 0429  Weight: 82.6 kg 83 kg 83.9 kg    Examination: General: No acute respiratory distress Lungs: Clear to auscultation bilaterally without wheezes or crackles Cardiovascular: Regular rate and rhythm without murmur gallop or rub normal S1 and S2 Abdomen: Nontender, nondistended, soft, bowel sounds positive, no rebound, no ascites, no appreciable mass Extremities: No  significant cyanosis, clubbing, or edema bilateral lower extremities  CBC: Recent Labs  Lab 09/27/18 1427 09/27/18 1440 09/28/18 0841  WBC 8.1  --  7.4  HGB 12.3* 13.3 12.8*  HCT 38.7* 39.0 40.8  MCV 94.9  --  96.5  PLT 241  --  485   Basic Metabolic Panel: Recent Labs  Lab 09/27/18 1427 09/27/18 1440 09/28/18 0841  NA 135 136 138  K 4.1 4.1 4.2  CL 105 105 104  CO2 22  --  25  GLUCOSE 132* 131* 110*  BUN 13 16 9   CREATININE 1.10 1.00 1.11  CALCIUM 9.5  --  9.1   GFR: Estimated Creatinine Clearance: 78.2 mL/min (by C-G formula based on SCr of 1.11 mg/dL).  Liver Function Tests: Recent Labs  Lab 09/27/18 1427  AST 24  ALT 21  ALKPHOS 43  BILITOT 1.0  PROT 6.7  ALBUMIN 3.7    Coagulation Profile: Recent Labs  Lab 09/27/18 1427 09/28/18 0841  INR 1.04 0.99    Cardiac Enzymes: Recent Labs  Lab 09/27/18 1944 09/28/18 0313 09/28/18 0841  TROPONINI 3.50* 4.74* 3.19*    HbA1C: Hgb A1c MFr Bld  Date/Time Value Ref Range Status  09/27/2018 07:44 PM 5.0 4.8 - 5.6 % Final    Comment:    (NOTE) Pre diabetes:          5.7%-6.4% Diabetes:              >6.4% Glycemic control for   <7.0% adults  with diabetes      Recent Results (from the past 240 hour(s))  MRSA PCR Screening     Status: None   Collection Time: 09/27/18  5:42 PM  Result Value Ref Range Status   MRSA by PCR NEGATIVE NEGATIVE Final    Comment:        The GeneXpert MRSA Assay (FDA approved for NASAL specimens only), is one component of a comprehensive MRSA colonization surveillance program. It is not intended to diagnose MRSA infection nor to guide or monitor treatment for MRSA infections. Performed at [facility], [place] [address], [place], [place] [address]      Scheduled Meds: . amLODipine  10 mg Oral Daily  . aspirin EC  81 mg Oral Daily  . atorvastatin  40 mg Oral q1800  . diltiazem  60 mg Oral Q6H  . lisinopril  40 mg Oral Daily  . nitroGLYCERIN  1 inch Topical  Q6H  . sodium chloride flush  3 mL Intravenous Q12H   Continuous Infusions: . sodium chloride    . heparin 1,200 Units/hr (09/28/18 1209)     LOS: 1 day   Cherene Altes, MD [facility] Office  [phone] Pager - Text Page per Amion  If 7PM-7AM, please contact night-coverage per Amion 09/28/2018, 2:37 PM

## 2018-09-28 NOTE — Progress Notes (Addendum)
Progress Note  Patient Name: Isaac Wilkerson Date of Encounter: 09/28/2018  Primary Cardiologist: Carlyle Dolly, MD   Subjective   Denies any SOB. Last episode of chest pain was last night, lasted 10 min   Inpatient Medications    Scheduled Meds: . amLODipine  10 mg Oral Daily  . aspirin EC  81 mg Oral Daily  . atorvastatin  40 mg Oral q1800  . diltiazem  60 mg Oral Q6H  . lisinopril  40 mg Oral Daily  . nitroGLYCERIN  1 inch Topical Q6H  . sodium chloride flush  3 mL Intravenous Q12H   Continuous Infusions: . sodium chloride    . heparin 1,200 Units/hr (09/27/18 2209)   PRN Meds: sodium chloride, acetaminophen, albuterol, ondansetron (ZOFRAN) IV, sodium chloride flush   Vital Signs    Vitals:   09/27/18 2035 09/28/18 0028 09/28/18 0429 09/28/18 0827  BP: (!) 148/91 (!) 146/75 133/77 (!) 142/98  Pulse: 77  (!) 59 (!) 58  Resp:      Temp: 98 F (36.7 C)  97.9 F (36.6 C) 97.7 F (36.5 C)  TempSrc: Oral  Oral Oral  SpO2: 97%  96% 99%  Weight:   83.9 kg   Height:        Intake/Output Summary (Last 24 hours) at 09/28/2018 0839 Last data filed at 09/28/2018 0700 Gross per 24 hour  Intake 606.57 ml  Output 450 ml  Net 156.57 ml   Filed Weights   09/27/18 1536 09/27/18 1700 09/28/18 0429  Weight: 82.6 kg 83 kg 83.9 kg    Telemetry    NSR without significant ventricular ectopy - Personally Reviewed  ECG    NSR with TWI in anterior leads - Personally Reviewed  Physical Exam   GEN: No acute distress.   Neck: No JVD Cardiac: RRR, no murmurs, rubs, or gallops.  Respiratory: Clear to auscultation bilaterally. GI: Soft, nontender, non-distended  MS: No edema; No deformity. Neuro:  Nonfocal  Psych: Normal affect   Labs    Chemistry Recent Labs  Lab 09/27/18 1427 09/27/18 1440  NA 135 136  K 4.1 4.1  CL 105 105  CO2 22  --   GLUCOSE 132* 131*  BUN 13 16  CREATININE 1.10 1.00  CALCIUM 9.5  --   PROT 6.7  --   ALBUMIN 3.7  --   AST 24  --    ALT 21  --   ALKPHOS 43  --   BILITOT 1.0  --   GFRNONAA >60  --   GFRAA >60  --   ANIONGAP 8  --      Hematology Recent Labs  Lab 09/27/18 1427 09/27/18 1440  WBC 8.1  --   RBC 4.08*  --   HGB 12.3* 13.3  HCT 38.7* 39.0  MCV 94.9  --   MCH 30.1  --   MCHC 31.8  --   RDW 12.0  --   PLT 241  --     Cardiac Enzymes Recent Labs  Lab 09/27/18 1944 09/28/18 0313  TROPONINI 3.50* 4.74*    Recent Labs  Lab 09/27/18 1438  TROPIPOC 0.39*     BNPNo results for input(s): BNP, PROBNP in the last 168 hours.   DDimer No results for input(s): DDIMER in the last 168 hours.   Radiology    Dg Chest Port 1 View  Result Date: 09/27/2018 CLINICAL DATA:  Chest pain EXAM: PORTABLE CHEST 1 VIEW COMPARISON:  None. FINDINGS: The heart size and  mediastinal contours are within normal limits. Both lungs are clear. Cardiac monitoring and defibrillator paddles project over the thorax. The visualized skeletal structures are unremarkable. IMPRESSION: No active disease. Electronically Signed   By: Ashley Royalty M.D.   On: 09/27/2018 14:25    Cardiac Studies   Echo 08/28/2016 LV EF: 65% -   70% Study Conclusions  - Left ventricle: The cavity size was normal. Wall thickness was   increased in a pattern of moderate LVH. Systolic function was   vigorous. The estimated ejection fraction was in the range of 65%   to 70%. Wall motion was normal; there were no regional wall   motion abnormalities. Doppler parameters are consistent with   abnormal left ventricular relaxation (grade 1 diastolic   dysfunction). - Aortic valve: Valve area (VTI): 3.2 cm^2. Valve area (Vmax): 2.89   cm^2. Valve area (Vmean): 2.95 cm^2. - Technically adequate study.  Patient Profile     52 y.o. male  with a hx of chronic combined CHF with improved EF, alcohol/cocaine use, HTN who is being seen for the evaluation of elevated troponin at the request of Dr. Jeanell Sparrow. Pending coronary CT  Assessment & Plan    1. Chest  pain with elevated trop  - EKG showed anterior TWI. Last episode of chest pain was last night, lasted 5-10 min  - last echo in Sept 2017 showed normal EF, however, stress test in Apr 2017 was nonischemic. UDT positive for cocaine, suspect cocaine induced vasospasm.   - plan for coronary CT today to be read by Dr. Aundra Dubin. Hesitant to proceed with cath given prior noncompliant followup history  2. Polysubstance abuse: positive UDS  3. HTN: not on BB due to cocaine use  4. H/o chronic combined CHF: EF reported as 25-30% in Apr 2017 at [place], [place] normalized by echo in Sept 2017. Pending repeat echo      For questions or updates, please contact [place] Please consult [email or web address] for contact info under        Signed, Almyra Deforest, [place]  09/28/2018, 8:39 AM    I have seen and examined the patient along with Almyra Deforest, PA.  I have reviewed the chart, notes and new data.  I agree with PA's note.  Key new complaints: Denies angina or dyspnea Key examination changes: No overt heart failure, occasional PVCs and ventricular couplets, rare 3 beat runs of nonsustained VT, otherwise maintaining sinus rhythm overnight Key new findings / data: Getting echo this morning, images not available for review yet.  Opponent peaked at approximately 4.7.  PLAN: We will review echo.  If there is a significant regional wall motion abnormality may have to go directly to cardiac catheterization, but for the time being coronary CT angiography seems to be a better choice.  Before consideration of nonemergent revascularization procedures I think it would be important to get some confidence in his ability to be compliant with medications and follow-up.  Sanda Klein, MD, [place] [phone] 09/28/2018, 11:55 AM

## 2018-09-28 NOTE — Progress Notes (Signed)
ANTICOAGULATION CONSULT NOTE  Pharmacy Consult for heparin Indication: chest pain/ACS  No Known Allergies  Patient Measurements: Height: 5\' 6"  (167.6 cm) Weight: 184 lb 14.4 oz (83.9 kg) IBW/kg (Calculated) : 63.8 Heparin Dosing Weight: 82.6 kg  Vital Signs: Temp: 97.7 F (36.5 C) (10/08 0827) Temp Source: Oral (10/08 0827) BP: 142/98 (10/08 0827) Pulse Rate: 58 (10/08 0827)  Labs: Recent Labs    09/27/18 1427 09/27/18 1440 09/27/18 1944 09/28/18 0313 09/28/18 0841  HGB 12.3* 13.3  --   --  12.8*  HCT 38.7* 39.0  --   --  40.8  PLT 241  --   --   --  249  LABPROT 13.6  --   --   --  13.0  INR 1.04  --   --   --  0.99  HEPARINUNFRC  --   --  0.14* 0.34 0.37  CREATININE 1.10 1.00  --   --  1.11  TROPONINI  --   --  3.50* 4.74* 3.19*    Estimated Creatinine Clearance: 78.2 mL/min (by C-G formula based on SCr of 1.11 mg/dL).  Assessment: CC/HPI: 52 yo m presenting with CP from [facility]  PMH: cocaine etoh CHF HTN  Anticoag: none pta - on heparin for ACS   Renal: SCr 1   Heme/Onc: H&H 13.3/39, Plt 241  Heparin level remains therapeutic. Next level with AM labs. No bleed documented.  Goal of Therapy:  Heparin level 0.3-0.7 units/ml Monitor platelets by anticoagulation protocol: Yes   Plan:  Continue heparin at 1200 units/hr Monitor daily heparin level and CBC, s/sx bleeding F/U Cards plans  Elicia Lamp, PharmD, BCPS Clinical Pharmacist Clinical phone [phone] Please check AMION for all [place] contact numbers 09/28/2018 9:57 AM

## 2018-09-28 NOTE — Progress Notes (Signed)
  Echocardiogram 2D Echocardiogram has been performed.  Isaac Wilkerson 09/28/2018, 12:29 PM

## 2018-09-29 ENCOUNTER — Inpatient Hospital Stay (HOSPITAL_COMMUNITY): Payer: Medicare Other

## 2018-09-29 DIAGNOSIS — R079 Chest pain, unspecified: Secondary | ICD-10-CM

## 2018-09-29 LAB — CBC
HEMATOCRIT: 39.4 % (ref 39.0–52.0)
HEMOGLOBIN: 11.9 g/dL — AB (ref 13.0–17.0)
MCH: 29.2 pg (ref 26.0–34.0)
MCHC: 30.2 g/dL (ref 30.0–36.0)
MCV: 96.6 fL (ref 80.0–100.0)
Platelets: 269 10*3/uL (ref 150–400)
RBC: 4.08 MIL/uL — AB (ref 4.22–5.81)
RDW: 12.2 % (ref 11.5–15.5)
WBC: 7.1 10*3/uL (ref 4.0–10.5)
nRBC: 0 % (ref 0.0–0.2)

## 2018-09-29 LAB — HEPARIN LEVEL (UNFRACTIONATED): Heparin Unfractionated: 0.31 IU/mL (ref 0.30–0.70)

## 2018-09-29 MED ORDER — ISOSORBIDE MONONITRATE ER 30 MG PO TB24
30.0000 mg | ORAL_TABLET | Freq: Every day | ORAL | Status: DC
  Administered 2018-09-29: 30 mg via ORAL
  Filled 2018-09-29: qty 1

## 2018-09-29 NOTE — Progress Notes (Signed)
[place] for heparin Indication: chest pain/ACS  No Known Allergies  Patient Measurements: Height: 5\' 6"  (167.6 cm) Weight: 184 lb 14.4 oz (83.9 kg) IBW/kg (Calculated) : 63.8 Heparin Dosing Weight: 82.6 kg  Vital Signs: BP: 142/78 (10/09 0900)  Labs: Recent Labs    09/27/18 1427 09/27/18 1440  09/27/18 1944 09/28/18 0313 09/28/18 0841 09/29/18 0508  HGB 12.3* 13.3  --   --   --  12.8* 11.9*  HCT 38.7* 39.0  --   --   --  40.8 39.4  PLT 241  --   --   --   --  249 269  LABPROT 13.6  --   --   --   --  13.0  --   INR 1.04  --   --   --   --  0.99  --   HEPARINUNFRC  --   --    < > 0.14* 0.34 0.37 0.31  CREATININE 1.10 1.00  --   --   --  1.11  --   TROPONINI  --   --   --  3.50* 4.74* 3.19*  --    < > = values in this interval not displayed.    Estimated Creatinine Clearance: 78.2 mL/min (by C-G formula based on SCr of 1.11 mg/dL).  Assessment: CC/HPI: 52 yo m presenting with CP from [facility]  PMH: cocaine etoh CHF HTN  Anticoag: none pta - on heparin for ACS   Renal: SCr up a bit to 1.11  Heme/Onc: cbc stable  Heparin level remains therapeutic at 0.31 near bottom of range. No bleed or issues with infusion per discussion with RN.  Goal of Therapy:  Heparin level 0.3-0.7 units/ml Monitor platelets by anticoagulation protocol: Yes   Plan:  Increase heparin slightly to 1250 units/hr to ensure stays in range Monitor daily heparin level and CBC, s/sx bleeding F/U Cards plans  Elicia Lamp, PharmD, BCPS Clinical Pharmacist Clinical phone [phone] Please check AMION for all [employer] contact numbers 09/29/2018 9:19 AM

## 2018-09-29 NOTE — Discharge Instructions (Signed)
Acute Coronary Syndrome °Acute coronary syndrome (ACS) is a serious problem in which there is suddenly not enough blood and oxygen reaching the heart. ACS can result in chest pain or a heart attack. °What are the causes? °This condition may be caused by: °· A buildup of fat and cholesterol inside of the arteries (atherosclerosis). This is the most common cause. The buildup (plaque) can cause the blood vessels in your heart (coronary arteries) to become narrow or blocked. Plaque can also break off to form a clot. °· A coronary spasm. °· A tearing of the coronary artery (spontaneous coronary artery dissection). °· Low blood pressure (hypotension). °· An abnormal heart beat (arrhythmia). °· Using cocaine or methamphetamine. ° °What increases the risk? °The following factors may make you more likely to develop this condition: °· Age. °· History of chest pain, heart attack, or stroke. °· Being male. °· Family history of chest pain, heart disease, or stroke. °· Smoking. °· Inactivity. °· Being overweight. °· High cholesterol. °· High blood pressure (hypertension). °· Diabetes. °· Excessive alcohol use. ° °What are the signs or symptoms? °Common symptoms of this condition include: °· Chest pain. The pain may last long, or may stop and come back (recur). It may feel like: °? Crushing or squeezing. °? Tightness, pressure, fullness, or heaviness. °· Arm, neck, jaw, or back pain. °· Heartburn or indigestion. °· Shortness of breath. °· Nausea. °· Sudden cold sweats. °· Lightheadedness. °· Dizziness. °· Tiredness (fatigue). ° °Sometimes there are no symptoms. °How is this diagnosed? °This condition may be diagnosed through: °· An electrocardiogram (ECG). This test records the impulses of the heart. °· Blood tests. °· A CT scan of the chest. °· A coronary angiogram. This procedure checks for a blockage in the coronary arteries. ° °How is this treated? °Treatment for this condition may include: °· Oxygen. °· Medicines, such  as: °? Antiplatelet medicines and blood-thinning medicines, such as aspirin. These help prevent blood clots. °? Fibrinolytic therapy. This breaks apart a blood clot. °? Blood pressure medicines. °? Nitroglycerin. °? Pain medicine. °? Cholesterol medicine. °· A procedure called coronary angioplasty and stenting. This is done to widen a narrowed artery and keep it open. °· Coronary artery bypass surgery. This allows blood to pass the blockage to reach your heart. °· Cardiac rehabilitation. This is a program that helps improve your health and well-being. It includes exercise training, education, and counseling to help you recover. ° °Follow these instructions at home: °Eating and drinking °· Follow a heart-healthy, low-salt (sodium) diet. °· Use healthy cooking methods such as roasting, grilling, broiling, baking, poaching, steaming, or stir-frying. °· Talk to a dietitian to learn about healthy cooking methods and how to eat less sodium. °Medicines °· Take over-the-counter and prescription medicines only as told by your health care provider. °· Do not take these medicines unless your health care provider approves: °? Nonsteroidal anti-inflammatory drugs (NSAIDs), such as ibuprofen, naproxen, or celecoxib. °? Vitamin supplements that contain vitamin A or vitamin E. °? Hormone replacement therapy that contains estrogen. °Activity °· Join a cardiac rehabilitation program. °· Ask your health care provider: °? What activities and exercises are safe for you. °? If you should follow specific instructions about lifting, driving, or climbing stairs. °· If you are taking aspirin and another blood thinning medicine, avoid activities that are likely to result in an injury. The medicines can increase your risk of bleeding. °Lifestyle °· Do not use any products that contain nicotine or tobacco, such as cigarettes   and e-cigarettes. If you need help quitting, ask your health care provider. °· If you drink alcohol and your health care  provider says it is okay to drink, limit your alcohol intake to no more than 1 drink per day. One drink equals 12 oz of beer, 5 oz of wine, or 1½ oz of hard liquor. °· Maintain a healthy weight. If you need to lose weight, do it in a way that has been approved by your health care provider. °General instructions °· Tell all your health care providers about your heart condition, including your dentist. Some medicines can increase your risk of arrhythmia. °· Manage other health conditions, such as hypertension and diabetes. These conditions affect your heart. °· Learn ways to manage stress. °· Get screened for depression, and seek treatment if needed. °· Monitor your blood pressure if told by your health care provider. °· Keep your vaccinations up to date. Get the annual influenza vaccine. °· Keep all follow-up visits as told by your health care provider. This is important. °Contact a health care provider if: °· You feel overwhelmed or sad. °· You have trouble with your daily activities. °Get help right away if: °· You have pain in your chest, neck, arm, jaw, stomach, or back that recurs, and: °? Lasts more than a few minutes. °? Is not relieved by taking the medicineyour health care provider prescribed. °· You have unexplained: °? Heavy sweating. °? Heartburn or indigestion. °? Shortness of breath. °? Difficulty breathing. °? Nausea or vomiting. °? Fatigue. °? Nervousness or anxiety. °? Weakness. °? Diarrhea. °? Dark stools or blood in the stool. °· You have sudden lightheadedness or dizziness. °· Your blood pressure is higher than 180/120 °· You faint. °· You feel like hurting yourself or think about taking your own life. °These symptoms may represent a serious problem that is an emergency. Do not wait to see if the symptoms will go away. Get medical help right away. Call your local emergency services (911 in the [place] Do not drive yourself to the clinic or hospital. °Summary °· Acute coronary syndrome (ACS) is a  when there is not enough blood and oxygen being supplied to the heart. ACS can result in chest pain or a heart attack. °· Acute coronary syndrome is a medical emergency. If you have any symptoms of this condition, get help right away. °· Treatment includes oxygen, medicines, and procedures to open the blocked arteries and restore blood flow. °This information is not intended to replace advice given to you by your health care provider. Make sure you discuss any questions you have with your health care provider. °Document Released: 12/08/2005 Document Revised: 01/09/2017 Document Reviewed: 01/09/2017 °Elsevier Interactive Patient Education © 2018 [employer] ° °

## 2018-09-29 NOTE — Progress Notes (Addendum)
Progress Note  Patient Name: Isaac Wilkerson Date of Encounter: 09/29/2018  Primary Cardiologist: Carlyle Dolly, MD   Subjective   No complaints this morning. Feeling back to his usual self. Denies recurrence of chest pain. No SOB or LE edema.   Inpatient Medications    Scheduled Meds: . amLODipine  10 mg Oral Daily  . aspirin EC  81 mg Oral Daily  . atorvastatin  40 mg Oral q1800  . diltiazem  10 mg Intravenous Once  . diltiazem  60 mg Oral Q6H  . lisinopril  40 mg Oral Daily  . nitroGLYCERIN  1 inch Topical Q6H  . nitroGLYCERIN  0.4 mg Sublingual Once  . sodium chloride flush  3 mL Intravenous Q12H   Continuous Infusions: . sodium chloride    . heparin 1,200 Units/hr (09/29/18 0105)   PRN Meds: sodium chloride, acetaminophen, albuterol, ondansetron (ZOFRAN) IV, sodium chloride flush   Vital Signs    Vitals:   09/28/18 1751 09/28/18 1900 09/29/18 0022 09/29/18 0602  BP: 136/81 140/77 137/82 128/70  Pulse:      Resp:      Temp:  98.2 F (36.8 C)    TempSrc:  Oral    SpO2:      Weight:      Height:        Intake/Output Summary (Last 24 hours) at 09/29/2018 0850 Last data filed at 09/28/2018 2300 Gross per 24 hour  Intake 1220.75 ml  Output -  Net 1220.75 ml   Filed Weights   09/27/18 1536 09/27/18 1700 09/28/18 0429  Weight: 82.6 kg 83 kg 83.9 kg    Telemetry    Sinus rhythm with occasional PVCs - Personally Reviewed  Physical Exam   GEN: Laying in bed with male companion in no acute distress.   Neck: No JVD, no carotid bruits Cardiac: RRR, no murmurs, rubs, or gallops.  Respiratory: Clear to auscultation bilaterally, no wheezes/ rales/ rhonchi GI: NABS, Soft, nontender, non-distended  MS: No edema; No deformity. Neuro:  Nonfocal, moving all extremities spontaneously Psych: Normal affect   Labs    Chemistry Recent Labs  Lab 09/27/18 1427 09/27/18 1440 09/28/18 0841  NA 135 136 138  K 4.1 4.1 4.2  CL 105 105 104  CO2 22  --  25    GLUCOSE 132* 131* 110*  BUN 13 16 9   CREATININE 1.10 1.00 1.11  CALCIUM 9.5  --  9.1  PROT 6.7  --   --   ALBUMIN 3.7  --   --   AST 24  --   --   ALT 21  --   --   ALKPHOS 43  --   --   BILITOT 1.0  --   --   GFRNONAA >60  --  >60  GFRAA >60  --  >60  ANIONGAP 8  --  9     Hematology Recent Labs  Lab 09/27/18 1427 09/27/18 1440 09/28/18 0841 09/29/18 0508  WBC 8.1  --  7.4 7.1  RBC 4.08*  --  4.23 4.08*  HGB 12.3* 13.3 12.8* 11.9*  HCT 38.7* 39.0 40.8 39.4  MCV 94.9  --  96.5 96.6  MCH 30.1  --  30.3 29.2  MCHC 31.8  --  31.4 30.2  RDW 12.0  --  12.0 12.2  PLT 241  --  249 269    Cardiac Enzymes Recent Labs  Lab 09/27/18 1944 09/28/18 0313 09/28/18 0841  TROPONINI 3.50* 4.74* 3.19*  Recent Labs  Lab 09/27/18 1438  TROPIPOC 0.39*     BNPNo results for input(s): BNP, PROBNP in the last 168 hours.   DDimer No results for input(s): DDIMER in the last 168 hours.   Radiology    Ct Coronary Morph W/cta Cor W/score W/ca W/cm &/or Wo/cm  Result Date: 09/28/2018 CLINICAL DATA:  Chest pain EXAM: Cardiac CTA MEDICATIONS: Sub lingual nitro. 4mg  and lopressor mg TECHNIQUE: The patient was scanned on a Siemens 782 slice scanner. Gantry rotation speed was 250 msecs. Collimation was 0.6 mm. A 100 kV prospective scan was triggered in the ascending thoracic aorta at 35-75% of the R-R interval. Average HR during the scan was 60 bpm. The 3D data set was interpreted on a dedicated work station using MPR, MIP and VRT modes. A total of 80cc of contrast was used. FINDINGS: Non-cardiac: See separate report from [facility]. The pulmonary veins drain normally to the left atrium. Calcium Score: 48 Agatston units. Coronary Arteries: Right dominant with no anomalies LM: Mixed plaque distal left main, no significant stenosis. LAD system: No plaque or stenosis. Circumflex system: Moderate ramus with calcified plaque, mild stenosis. No plaque or stenosis in AV LCx. RCA system:  Mixed plaque proximal RCA, possible moderate stenosis. IMPRESSION: 1. Coronary artery calcium score 48 Agatston units. This places the patient in the 75th percentile for age and gender, suggesting intermediate to high risk for future cardiac events. 2. 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 Will send for FFR to confirm hemodynamic significance. Dalton Mclean Electronically Signed   By: Loralie Champagne M.D.   On: 09/28/2018 17:11   Dg Chest Port 1 View  Result Date: 09/27/2018 CLINICAL DATA:  Chest pain EXAM: PORTABLE CHEST 1 VIEW COMPARISON:  None. FINDINGS: The heart size and mediastinal contours are within normal limits. Both lungs are clear. Cardiac monitoring and defibrillator paddles project over the thorax. The visualized skeletal structures are unremarkable. IMPRESSION: No active disease. Electronically Signed   By: Ashley Royalty M.D.   On: 09/27/2018 14:25    Cardiac Studies   Echocardiogram 09/28/18: Study Conclusions  - Left ventricle: The cavity size was normal. Wall thickness was   increased in a pattern of mild LVH. There was mild concentric   hypertrophy. Systolic function was normal. The estimated ejection   fraction was in the range of 55% to 60%. There is hypokinesis of   the apicalanterolateral and apical myocardium. Doppler parameters   are consistent with abnormal left ventricular relaxation (grade 1   diastolic dysfunction).  Impressions:  - Apical hypokinesis with overall preserved LV function; mild LVH;   mild diastolic dysfunction.  Coronary CTA 09/28/18: IMPRESSION: 1. Coronary artery calcium score 48 Agatston units. This places the patient in the 75th percentile for age and gender, suggesting intermediate to high risk for future cardiac events.  2. 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 Will send for FFR to confirm hemodynamic significance.   Patient Profile     52 y.o. male with a PMH of chronic combined CHF with improved EF, alcohol/cocaine abuse, and  HTN, who is being followed by cardiology for an NSTEMI.   Assessment & Plan    1. NSTEMI: patient presented with chest pain felt to be 2/2 recent cocaine use. Found to have elevated troponins, peaked at 4.74. EKG with TWI in anterior leads. Echo yesterday with apical hypokinesis. Coronary CTA with elevated calcium score and moderate stenosis in the pRCA, sent for FFR for further evaluation. Cath was deferred given compliance  issues.  - Await FFR results - Continue heparin gtt and nitro paste for now.   2. HTN: BP generally above goal. On max dose of amlodipine and lisinopril. Cocaine use and non-compliance limiting additional medications. Was on diltiazem 60mg  q6h to lower HR in prep for coronary CTA  - Continue amlodipine and lisinopril for now - Stop diltiazem at this time  - Unable to add BBlocker given cocaine abuse. Not likely to be complaint with multiple x/day dosing. Could consider clonidine patch if remains poorly controlled.  3. Chronic combined CHF: EF previously 25-30% 03/2016 which normalized by echo 08/2016. Echo this admission with EF 55-60%, G1DD. He appears euvolemic on exam - Continue lisinopril - Unable to add BBlocker given cocaine abuse.   4. Polysubstance abuse: Utox positive for cocaine and benzos this admission.  - Continue to encourage cessation.      For questions or updates, please contact [place] Please consult [email or web address] for contact info under Cardiology/STEMI.      Signed, Abigail Butts, PA-C  09/29/2018, 8:50 AM   [phone]   I have seen and examined the patient along with Abigail Butts, PA-C .  I have reviewed the chart, notes and new data.  I agree with PA's note.  Key new complaints: No angina, no dyspnea Key examination changes: No clinical findings of congestive heart failure, no significant arrhythmia on monitor (some mild bradycardia overnight following administration of diltiazem)  Key new findings / data: Reviewed CT  coronary angiograms.  There is a questionable moderate lesion in the right coronary artery, but this is not as ECG changes or with a wall motion abnormality seen echocardiography.  This suggests that his symptoms were more likely related to cocaine induced vasospasm.  Calcium score is increased at 75th percentile.  PLAN: No plan for invasive angiography. Discussed risk of recurrent myocardial infarction or fatal arrhythmia with any future use of cocaine.  He should be completely abstinent. Avoid beta-blockers. Maximum dose amlodipine and low-dose nitrates for coronary vasodilation. Started on statin.  Target LDL less than 70. Preserved LVEF and no clinical heart failure.  Ready for discharge from cardiology point of view. We will arrange follow-up in our [place] office.   Sanda Klein, MD, [place] [phone] 09/29/2018, 12:02 PM

## 2018-09-29 NOTE — Discharge Summary (Signed)
Physician Discharge Summary  Isaac Wilkerson WCH:[id number] DOB: 25-Jan-1966 DOA: 09/27/2018  PCP: Neale Burly, MD  Admit date: 09/27/2018 Discharge date: 09/29/2018  Admitted From: Home Disposition: Home  Recommendations for Outpatient Follow-up:  1. Follow up with PCP in 1-2 weeks 2. Please obtain BMP/CBC in one week 3. Please check your blood pressure at least once a week 4. Please refrain from any recreational drug use  Home Health: No Equipment/Devices: None  Discharge Condition: Stable CODE STATUS: Full Diet recommendation: Heart Healthy   Brief/Interim Summary:  #) NSTEMI: Patient was admitted with chest pain in the setting of cocaine use and noted to have elevated troponin.  Echo showed normal ejection fraction with some wall motion abnormalities.  Patient was placed on heparin drip.  Cardiology was consulted and performed a CT coronary with fractional flow reserve.  They felt the patient would not benefit from cardiac catheterization partially due to compliance issues.  Patient was managed medically.  Beta-blocker use was limited by cocaine abuse.  #) Hypertension: Patient was continued on home statin, lisinopril 40 mg, isosorbide mononitrate 60 mg, amlodipine 10 mg, aspirin 81 mg.  Addition of a beta-blocker was limited due to cocaine abuse.  #) Cocaine abuse: Patient was counseled against cocaine use.  #) Resolved systolic heart failure: Patient has a history of systolic heart failure that was of unclear etiology but possible related to substance abuse.  His echo showed that his heart failure had resolved.  Discharge Diagnoses:  Principal Problem:   NSTEMI (non-ST elevated myocardial infarction) ([place]) Active Problems:   Essential hypertension   Chronic combined systolic (congestive) and diastolic (congestive) heart failure (HCC)   Tobacco abuse   Polysubstance abuse [facility])    Discharge Instructions  Discharge Instructions    Call MD for:  difficulty breathing,  headache or visual disturbances   Complete by:  As directed    Call MD for:  hives   Complete by:  As directed    Call MD for:  persistant nausea and vomiting   Complete by:  As directed    Call MD for:  redness, tenderness, or signs of infection (pain, swelling, redness, odor or green/yellow discharge around incision site)   Complete by:  As directed    Call MD for:  severe uncontrolled pain   Complete by:  As directed    Call MD for:  temperature >100.4   Complete by:  As directed    Diet - low sodium heart healthy   Complete by:  As directed    Discharge instructions   Complete by:  As directed    Please follow-up with your primary care doctor in 1 to 2 weeks.  Please take your medications as prescribed.   Increase activity slowly   Complete by:  As directed      Allergies as of 09/29/2018   No Known Allergies     Medication List    TAKE these medications   albuterol 108 (90 Base) MCG/ACT inhaler Commonly known as:  PROVENTIL HFA;VENTOLIN HFA Inhale 1-2 puffs into the lungs every 6 (six) hours as needed for wheezing or shortness of breath.   albuterol (2.5 MG/3ML) 0.083% nebulizer solution Commonly known as:  PROVENTIL Inhale 2.5 mg into the lungs every 6 (six) hours as needed for wheezing or shortness of breath.   amLODipine 10 MG tablet Commonly known as:  NORVASC Take 10 mg by mouth daily.   aspirin EC 81 MG tablet Take 81 mg by mouth daily.  furosemide 40 MG tablet Commonly known as:  LASIX Take 40 mg by mouth daily.   isosorbide mononitrate 60 MG 24 hr tablet Commonly known as:  IMDUR Take 60 mg by mouth daily.   lisinopril 40 MG tablet Commonly known as:  PRINIVIL,ZESTRIL Take 40 mg by mouth daily.   potassium chloride SA 20 MEQ tablet Commonly known as:  K-DUR,KLOR-CON Take 20 mEq by mouth daily.   pravastatin 40 MG tablet Commonly known as:  PRAVACHOL Take 40 mg by mouth every evening.      Follow-up Information    Arnoldo Lenis, MD  Follow up on 10/18/2018.   Specialty:  Cardiology Why:  Please arrive 15 minutes early for your 1:40pm post-hospital cardiology follow-up appointment Contact information: [address] [place] [address] [phone]          No Known Allergies  Consultations:  Cardiology   Procedures/Studies: Ct Coronary Morph W/cta Cor W/score W/ca W/cm &/or Wo/cm  Addendum Date: 09/29/2018   ADDENDUM REPORT: 09/29/2018 09:08 EXAM: OVER-READ INTERPRETATION  CT CHEST The following report is an over-read performed by radiologist Dr. Fonnie Birkenhead [facility] on 09/29/2018. This over-read does not include interpretation of cardiac or coronary anatomy or pathology. The interpretation by the cardiologist is attached. COMPARISON:  None. FINDINGS: Visualized portions of mediastinum, hilar regions, esophagus, lungs, liver, adrenal glands, spleen, stomach and bones are unremarkable. IMPRESSION: No acute findings. Electronically Signed   By: Lorin Picket M.D.   On: 09/29/2018 09:08   Result Date: 09/29/2018 CLINICAL DATA:  Chest pain EXAM: Cardiac CTA MEDICATIONS: Sub lingual nitro. 4mg  and lopressor mg TECHNIQUE: The patient was scanned on a Siemens 774 slice scanner. Gantry rotation speed was 250 msecs. Collimation was 0.6 mm. A 100 kV prospective scan was triggered in the ascending thoracic aorta at 35-75% of the R-R interval. Average HR during the scan was 60 bpm. The 3D data set was interpreted on a dedicated work station using MPR, MIP and VRT modes. A total of 80cc of contrast was used. FINDINGS: Non-cardiac: See separate report from [facility]. The pulmonary veins drain normally to the left atrium. Calcium Score: 48 Agatston units. Coronary Arteries: Right dominant with no anomalies LM: Mixed plaque distal left main, no significant stenosis. LAD system: No plaque or stenosis. Circumflex system: Moderate ramus with calcified plaque, mild stenosis. No plaque or stenosis in AV  LCx. RCA system: Mixed plaque proximal RCA, possible moderate stenosis. IMPRESSION: 1. Coronary artery calcium score 48 Agatston units. This places the patient in the 75th percentile for age and gender, suggesting intermediate to high risk for future cardiac events. 2. 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 Will send for FFR to confirm hemodynamic significance. 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 On: 09/28/2018 17:11   Dg Chest Port 1 View  Result Date: 09/27/2018 CLINICAL DATA:  Chest pain EXAM: PORTABLE CHEST 1 VIEW COMPARISON:  None. FINDINGS: The heart size and mediastinal contours are within normal limits. Both lungs are clear. Cardiac monitoring and defibrillator paddles project over the thorax. The visualized skeletal structures are unremarkable. IMPRESSION: No active disease. Electronically Signed   By: Ashley Royalty M.D.   On: 09/27/2018 14:25    Echo 09/28/2018:- Left ventricle: The cavity size was normal. Wall thickness was   increased in a pattern of mild LVH. There was mild concentric   hypertrophy. Systolic function was normal. The estimated ejection   fraction was in the range of 55% to 60%. There  is hypokinesis of   the apicalanterolateral and apical myocardium. Doppler parameters   are consistent with abnormal left ventricular relaxation (grade 1   diastolic dysfunction).  Impressions:  - Apical hypokinesis with overall preserved LV function; mild LVH;   mild diastolic dysfunction.  Subjective:   Discharge Exam: Vitals:   09/29/18 0900 09/29/18 1300  BP: (!) 142/78 125/80  Pulse:  84  Resp:  18  Temp:  98.3 F (36.8 C)  SpO2:  99%   Vitals:   09/29/18 0022 09/29/18 0602 09/29/18 0900 09/29/18 1300  BP: 137/82 128/70 (!) 142/78 125/80  Pulse:    84  Resp:    18  Temp:    98.3 F (36.8 C)  TempSrc:    Oral  SpO2:    99%  Weight:      Height:        General: Pt is alert, awake, not in acute distress Cardiovascular: RRR, S1/S2 +, no  rubs, no gallops Respiratory: CTA bilaterally, no wheezing, no rhonchi Abdominal: Soft, NT, ND, bowel sounds + Extremities: no edema    The results of significant diagnostics from this hospitalization (including imaging, microbiology, ancillary and laboratory) are listed below for reference.     Microbiology: Recent Results (from the past 240 hour(s))  MRSA PCR Screening     Status: None   Collection Time: 09/27/18  5:42 PM  Result Value Ref Range Status   MRSA by PCR NEGATIVE NEGATIVE Final    Comment:        The GeneXpert MRSA Assay (FDA approved for NASAL specimens only), is one component of a comprehensive MRSA colonization surveillance program. It is not intended to diagnose MRSA infection nor to guide or monitor treatment for MRSA infections. Performed at [facility], [place] [address], [place], [place] [address]      Labs: BNP (last 3 results) No results for input(s): BNP in the last 8760 hours. Basic Metabolic Panel: Recent Labs  Lab 09/27/18 1427 09/27/18 1440 09/28/18 0841  NA 135 136 138  K 4.1 4.1 4.2  CL 105 105 104  CO2 22  --  25  GLUCOSE 132* 131* 110*  BUN 13 16 9   CREATININE 1.10 1.00 1.11  CALCIUM 9.5  --  9.1   Liver Function Tests: Recent Labs  Lab 09/27/18 1427  AST 24  ALT 21  ALKPHOS 43  BILITOT 1.0  PROT 6.7  ALBUMIN 3.7   No results for input(s): LIPASE, AMYLASE in the last 168 hours. No results for input(s): AMMONIA in the last 168 hours. CBC: Recent Labs  Lab 09/27/18 1427 09/27/18 1440 09/28/18 0841 09/29/18 0508  WBC 8.1  --  7.4 7.1  HGB 12.3* 13.3 12.8* 11.9*  HCT 38.7* 39.0 40.8 39.4  MCV 94.9  --  96.5 96.6  PLT 241  --  249 269   Cardiac Enzymes: Recent Labs  Lab 09/27/18 1944 09/28/18 0313 09/28/18 0841  TROPONINI 3.50* 4.74* 3.19*   BNP: Invalid input(s): POCBNP CBG: No results for input(s): GLUCAP in the last 168 hours. D-Dimer No results for input(s): DDIMER in the last 72 hours. Hgb  A1c Recent Labs    09/27/18 1944  HGBA1C 5.0   Lipid Profile Recent Labs    09/28/18 0313  CHOL 182  HDL 51  LDLCALC 77  TRIG 269*  CHOLHDL 3.6   Thyroid function studies Recent Labs    09/27/18 1944  TSH 1.006   Anemia work up No results for input(s):  VITAMINB12, FOLATE, FERRITIN, TIBC, IRON, RETICCTPCT in the last 72 hours. Urinalysis No results found for: COLORURINE, APPEARANCEUR, [place], [place], [place], [place], [place], [place], PROTEINUR, UROBILINOGEN, NITRITE, LEUKOCYTESUR Sepsis Labs Invalid input(s): PROCALCITONIN,  WBC,  LACTICIDVEN Microbiology Recent Results (from the past 240 hour(s))  MRSA PCR Screening     Status: None   Collection Time: 09/27/18  5:42 PM  Result Value Ref Range Status   MRSA by PCR NEGATIVE NEGATIVE Final    Comment:        The GeneXpert MRSA Assay (FDA approved for NASAL specimens only), is one component of a comprehensive MRSA colonization surveillance program. It is not intended to diagnose MRSA infection nor to guide or monitor treatment for MRSA infections. Performed at [facility], [place] [address], [place], [place] [address]      Time coordinating discharge: Over 30 minutes  SIGNED:   Cristy Folks, MD  [facility] 09/29/2018, 3:28 PM  If 7PM-7AM, please contact night-coverage [email or web address] Password [email or web address]

## 2018-10-10 DIAGNOSIS — R079 Chest pain, unspecified: Secondary | ICD-10-CM | POA: Diagnosis not present

## 2018-10-10 DIAGNOSIS — F419 Anxiety disorder, unspecified: Secondary | ICD-10-CM | POA: Diagnosis not present

## 2018-10-10 DIAGNOSIS — G4730 Sleep apnea, unspecified: Secondary | ICD-10-CM | POA: Diagnosis not present

## 2018-10-10 DIAGNOSIS — I252 Old myocardial infarction: Secondary | ICD-10-CM | POA: Diagnosis not present

## 2018-10-10 DIAGNOSIS — Z7982 Long term (current) use of aspirin: Secondary | ICD-10-CM | POA: Diagnosis not present

## 2018-10-10 DIAGNOSIS — Z87891 Personal history of nicotine dependence: Secondary | ICD-10-CM | POA: Diagnosis not present

## 2018-10-10 DIAGNOSIS — Z79899 Other long term (current) drug therapy: Secondary | ICD-10-CM | POA: Diagnosis not present

## 2018-10-10 DIAGNOSIS — M79602 Pain in left arm: Secondary | ICD-10-CM | POA: Diagnosis not present

## 2018-10-10 DIAGNOSIS — E7800 Pure hypercholesterolemia, unspecified: Secondary | ICD-10-CM | POA: Diagnosis not present

## 2018-10-10 DIAGNOSIS — I509 Heart failure, unspecified: Secondary | ICD-10-CM | POA: Diagnosis not present

## 2018-10-10 DIAGNOSIS — I110 Hypertensive heart disease with heart failure: Secondary | ICD-10-CM | POA: Diagnosis not present

## 2018-10-10 DIAGNOSIS — R1110 Vomiting, unspecified: Secondary | ICD-10-CM | POA: Diagnosis not present

## 2018-10-10 DIAGNOSIS — J449 Chronic obstructive pulmonary disease, unspecified: Secondary | ICD-10-CM | POA: Diagnosis not present

## 2018-10-10 DIAGNOSIS — T424X5A Adverse effect of benzodiazepines, initial encounter: Secondary | ICD-10-CM | POA: Diagnosis not present

## 2018-10-18 ENCOUNTER — Ambulatory Visit: Payer: Medicare Other | Admitting: Cardiology

## 2018-12-02 ENCOUNTER — Ambulatory Visit: Payer: Medicare Other | Admitting: Cardiology

## 2018-12-02 NOTE — Progress Notes (Deleted)
Clinical Summary Mr. Brander is a 52 y.o.male seen today for follow up of the following medical problems.   1. Chronic systolic HF - 12/7614 echo LVEF 25-30%, abnormal diastolic function - 0/7371 Lexiscan: inferobasilar to mid inferior gut attenuation vs inferior infarct, no ischemia.  - from Dr Dion Body notes plan was for trial of medical therapy, if LVEF not improved plan for cath at that time. - compliant with meds. Does not weight at home, though reports recent weight gain. From 04/2016 appt with Dr Hamilton Capri he is up 14 lbs.  - walks 2-4 miles 3 days a week without troubles.  - no orthopnea, no PND.  CHF risk factors: father with history of CHF, brother with "weak heart", reports previous heavy EtOH abuse but has now stopped, previous crack usebut also stopped  - since last visit repeated echo 08/2016, LVEF at 06-26%, grade I diastolic dysfunction.  - SOB a few days ago no resolved. No LE edema.   2. HTN - just took a meds prior to coming to clinic. Compliant with meds   3. Chest pain - admission 09/2018 with chest pain, elevated troponin. Trop up to 4.74. Cocaine + - 09/2018 echo LVEF 55-60%, apicalanterolateral hypokinesis - 09/2018 coronary CTA FFR no significant disease.  - supsected cocaine induced vasospasm.   4. Polysubsrance abuse   Past Medical History:  Diagnosis Date  . Alcohol abuse   . CAD (coronary artery disease)   . Chronic combined systolic and diastolic CHF (congestive heart failure) ([place])   . Cocaine use   . Hypertension      No Known Allergies   Current Outpatient Medications  Medication Sig Dispense Refill  . albuterol (PROVENTIL HFA;VENTOLIN HFA) 108 (90 Base) MCG/ACT inhaler Inhale 1-2 puffs into the lungs every 6 (six) hours as needed for wheezing or shortness of breath.    Marland Kitchen albuterol (PROVENTIL) (2.5 MG/3ML) 0.083% nebulizer solution Inhale 2.5 mg into the lungs every 6 (six) hours as needed for wheezing or shortness of breath.      Marland Kitchen amLODipine (NORVASC) 10 MG tablet Take 10 mg by mouth daily.    Marland Kitchen aspirin EC 81 MG tablet Take 81 mg by mouth daily.    . furosemide (LASIX) 40 MG tablet Take 40 mg by mouth daily.     . isosorbide mononitrate (IMDUR) 60 MG 24 hr tablet Take 60 mg by mouth daily.     Marland Kitchen lisinopril (PRINIVIL,ZESTRIL) 40 MG tablet Take 40 mg by mouth daily.     . potassium chloride SA (K-DUR,KLOR-CON) 20 MEQ tablet Take 20 mEq by mouth daily.     . pravastatin (PRAVACHOL) 40 MG tablet Take 40 mg by mouth every evening.  0   No current facility-administered medications for this visit.      Past Surgical History:  Procedure Laterality Date  . EAR CYST EXCISION       No Known Allergies    Family History  Problem Relation Age of Onset  . Congestive Heart Failure Father   . CAD Father 66  . Cardiomyopathy Brother   . CAD Brother 61  . CAD Mother 37     Social History Mr. Holtsclaw reports that he has been smoking. He has a 20.00 pack-year smoking history. He has never used smokeless tobacco. Mr. Kearse reports current alcohol use of about 3.0 standard drinks of alcohol per week.   Review of Systems CONSTITUTIONAL: No weight loss, fever, chills, weakness or fatigue.  HEENT: Eyes: No visual  loss, blurred vision, double vision or yellow sclerae.No hearing loss, sneezing, congestion, runny nose or sore throat.  SKIN: No rash or itching.  CARDIOVASCULAR:  RESPIRATORY: No shortness of breath, cough or sputum.  GASTROINTESTINAL: No anorexia, nausea, vomiting or diarrhea. No abdominal pain or blood.  GENITOURINARY: No burning on urination, no polyuria NEUROLOGICAL: No headache, dizziness, syncope, paralysis, ataxia, numbness or tingling in the extremities. No change in bowel or bladder control.  MUSCULOSKELETAL: No muscle, back pain, joint pain or stiffness.  LYMPHATICS: No enlarged nodes. No history of splenectomy.  PSYCHIATRIC: No history of depression or anxiety.  ENDOCRINOLOGIC: No reports  of sweating, cold or heat intolerance. No polyuria or polydipsia.  Marland Kitchen   Physical Examination There were no vitals filed for this visit. There were no vitals filed for this visit.  Gen: resting comfortably, no acute distress HEENT: no scleral icterus, pupils equal round and reactive, no palptable cervical adenopathy,  CV Resp: Clear to auscultation bilaterally GI: abdomen is soft, non-tender, non-distended, normal bowel sounds, no hepatosplenomegaly MSK: extremities are warm, no edema.  Skin: warm, no rash Neuro:  no focal deficits Psych: appropriate affect   Diagnostic Studies 08/2016 echo Study Conclusions  - Left ventricle: The cavity size was normal. Wall thickness was increased in a pattern of moderate LVH. Systolic function was vigorous. The estimated ejection fraction was in the range of 65% to 70%. Wall motion was normal; there were no regional wall motion abnormalities. Doppler parameters are consistent with abnormal left ventricular relaxation (grade 1 diastolic dysfunction). - Aortic valve: Valve area (VTI): 3.2 cm^2. Valve area (Vmax): 2.89 cm^2. Valve area (Vmean): 2.95 cm^2. - Technically adequate study.    Assessment and Plan  1. Chronic systolic HF - LVEF 19-75%, NYHA I. Repeat echo shows LVEF has now normalized, LVEF 65-70% -no recent symptoms, continue current meds at this time  2. HTN - elevated in clinic, he has just taken his meds prior to coming. BP at last visit on current meds 136/80 - continue to monitor at this time.   F/u 6 months. Give list of local pcp's      Arnoldo Lenis, M.D., F.A.C.C.

## 2018-12-14 ENCOUNTER — Ambulatory Visit: Payer: Medicare Other | Admitting: Cardiology

## 2018-12-14 ENCOUNTER — Encounter

## 2019-01-05 ENCOUNTER — Ambulatory Visit (INDEPENDENT_AMBULATORY_CARE_PROVIDER_SITE_OTHER): Payer: Medicare Other | Admitting: Cardiology

## 2019-01-05 ENCOUNTER — Encounter

## 2019-01-05 ENCOUNTER — Encounter: Payer: Self-pay | Admitting: Cardiology

## 2019-01-05 VITALS — BP 135/91 | HR 93 | Ht 67.0 in | Wt 204.4 lb

## 2019-01-05 DIAGNOSIS — I10 Essential (primary) hypertension: Secondary | ICD-10-CM

## 2019-01-05 DIAGNOSIS — I5022 Chronic systolic (congestive) heart failure: Secondary | ICD-10-CM | POA: Diagnosis not present

## 2019-01-05 NOTE — Progress Notes (Signed)
Clinical Summary Isaac Wilkerson is a 53 y.o.male seen today for follow up of the following medical problems.   1. Chronic systolic HF - 04/1699 echo LVEF 25-30%, abnormal diastolic function - 12/7492 Lexiscan: inferobasilar to mid inferior gut attenuation vs inferior infarct, no ischemia.  - from Dr Dion Body notes plan was for trial of medical therapy, if LVEF not improved plan for cath at that time. - compliant with meds. Does not weight at home, though reports recent weight gain. From 04/2016 appt with Dr Hamilton Capri he is up 14 lbs.  CHF risk factors: father with history of CHF, brother with "weak heart", reports previous heavy EtOH abuse but has now stopped, previous crack usebut also stopped  -  repeated echo 08/2016, LVEF at 49-67%, grade I diastolic dysfunction.  - SOB a few days ago now resolved. No LE edema.   2. HTN  has not takeing meds yet today  3. Chest pain - admit 09/2018 with chest pain. Cocaine +, did have elevation of troponins peak 4.7. 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 - no recent chest pain, no SOB    Past Medical History:  Diagnosis Date  . Alcohol abuse   . CAD (coronary artery disease)   . Chronic combined systolic and diastolic CHF (congestive heart failure) ([place])   . Cocaine use   . Hypertension      No Known Allergies   Current Outpatient Medications  Medication Sig Dispense Refill  . albuterol (PROVENTIL HFA;VENTOLIN HFA) 108 (90 Base) MCG/ACT inhaler Inhale 1-2 puffs into the lungs every 6 (six) hours as needed for wheezing or shortness of breath.    Marland Kitchen albuterol (PROVENTIL) (2.5 MG/3ML) 0.083% nebulizer solution Inhale 2.5 mg into the lungs every 6 (six) hours as  needed for wheezing or shortness of breath.     Marland Kitchen amLODipine (NORVASC) 10 MG tablet Take 10 mg by mouth daily.    Marland Kitchen aspirin EC 81 MG tablet Take 81 mg by mouth daily.    . furosemide (LASIX) 40 MG tablet Take 40 mg by mouth daily.     . isosorbide mononitrate (IMDUR) 60 MG 24 hr tablet Take 60 mg by mouth daily.     Marland Kitchen lisinopril (PRINIVIL,ZESTRIL) 40 MG tablet Take 40 mg by mouth daily.     . potassium chloride SA (K-DUR,KLOR-CON) 20 MEQ tablet Take 20 mEq by mouth daily.     . pravastatin (PRAVACHOL) 40 MG tablet Take 40 mg by mouth every evening.  0   No current facility-administered medications for this visit.      Past Surgical History:  Procedure Laterality Date  . EAR CYST EXCISION       No Known Allergies    Family History  Problem Relation Age of Onset  . Congestive Heart Failure Father   . CAD Father 32  . Cardiomyopathy Brother   . CAD Brother 78  . CAD Mother 36     Social History Isaac Wilkerson reports that he has been smoking. He has a 20.00 pack-year smoking history. He has never used smokeless tobacco. Isaac Wilkerson reports current alcohol use of about 3.0 standard drinks of alcohol per week.   Review of Systems CONSTITUTIONAL: No weight loss, fever, chills,  weakness or fatigue.  HEENT: Eyes: No visual loss, blurred vision, double vision or yellow sclerae.No hearing loss, sneezing, congestion, runny nose or sore throat.  SKIN: No rash or itching.  CARDIOVASCULAR: per hpi RESPIRATORY: No shortness of breath, cough or sputum.  GASTROINTESTINAL: No anorexia, nausea, vomiting or diarrhea. No abdominal pain or blood.  GENITOURINARY: No burning on urination, no polyuria NEUROLOGICAL: No headache, dizziness, syncope, paralysis, ataxia, numbness or tingling in the extremities. No change in bowel or bladder control.  MUSCULOSKELETAL: No muscle, back pain, joint pain or stiffness.  LYMPHATICS: No enlarged nodes. No history of splenectomy.  PSYCHIATRIC: No history of  depression or anxiety.  ENDOCRINOLOGIC: No reports of sweating, cold or heat intolerance. No polyuria or polydipsia.  Marland Kitchen   Physical Examination Vitals:   01/05/19 1459  BP: (!) 135/91  Pulse: 93  SpO2: 99%   Vitals:   01/05/19 1459  Weight: 204 lb 6.4 oz (92.7 kg)  Height: 5\' 7"  (1.702 m)    Gen: resting comfortably, no acute distress HEENT: no scleral icterus, pupils equal round and reactive, no palptable cervical adenopathy,  CV: RRR, no m/r/g, no jvd Resp: Clear to auscultation bilaterally GI: abdomen is soft, non-tender, non-distended, normal bowel sounds, no hepatosplenomegaly MSK: extremities are warm, no edema.  Skin: warm, no rash Neuro:  no focal deficits Psych: appropriate affect   Diagnostic Studies 08/2016 echo Study Conclusions  - Left ventricle: The cavity size was normal. Wall thickness was increased in a pattern of moderate LVH. Systolic function was vigorous. The estimated ejection fraction was in the range of 65% to 70%. Wall motion was normal; there were no regional wall motion abnormalities. Doppler parameters are consistent with abnormal left ventricular relaxation (grade 1 diastolic dysfunction). - Aortic valve: Valve area (VTI): 3.2 cm^2. Valve area (Vmax): 2.89 cm^2. Valve area (Vmean): 2.95 cm^2. - Technically adequate study.   Echocardiogram 09/28/18: Study Conclusions  - Left ventricle: The cavity size was normal. Wall thickness was increased in a pattern of mild LVH. There was mild concentric hypertrophy. Systolic function was normal. The estimated ejection fraction was in the range of 55% to 60%. There is hypokinesis of the apicalanterolateral and apical myocardium. Doppler parameters are consistent with abnormal left ventricular relaxation (grade 1 diastolic dysfunction).  Impressions:  - Apical hypokinesis with overall preserved LV function; mild LVH; mild diastolic dysfunction.  Coronary CTA  09/28/18: IMPRESSION: 1. Coronary artery calcium score 48 Agatston units. This places the patient in the 75th percentile for age and gender, suggesting intermediate to high risk for future cardiac events.  2. 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 Will send for FFR to confirm hemodynamic significance.  Assessment and Plan  1. Chronic systolic HF - LVEF 03-70%, NYHA I. Repeat echo shows LVEF has now normalized, LVEF 65-70% -recent admit with elevated trop in setting of cocaine use, coronary CTA without significinat disease. He reports he has stopped his prior drug use - continue medical therapy.   2. HTN - midly elevated but has not taken meds yet today, continue to monitor.    F/u 6 months      Arnoldo Lenis, M.D.

## 2019-01-05 NOTE — Patient Instructions (Signed)

## 2019-02-19 IMAGING — CT CT CORONARY FRACTIONAL FLOW RESERVE
4 of 7 series · 8 of 20 positions shown, 9 images · IV contrast (APPLIED)
Comparison: none

CLINICAL DATA: Chest pain

EXAM:
CT FFR
MEDICATIONS:
No additional medications.
TECHNIQUE: The coronary CTA was sent for FFR.

[Series 6: best diast 70 % · axial · 0.39mm/px · z∈[+1092,+1146]mm · 2 of 402 slices shown, 3 images]
[im 134/402  vessel]
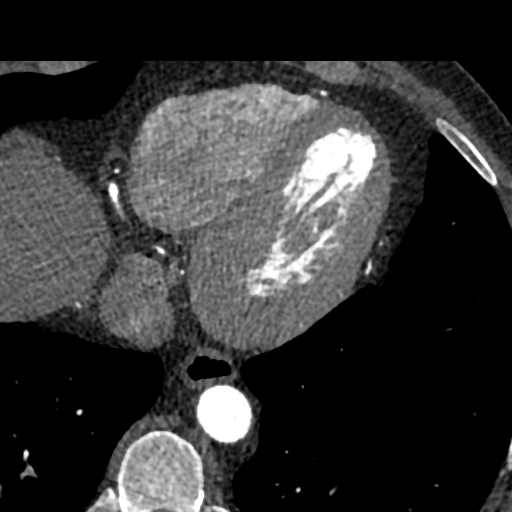
[im 134/402  lung]
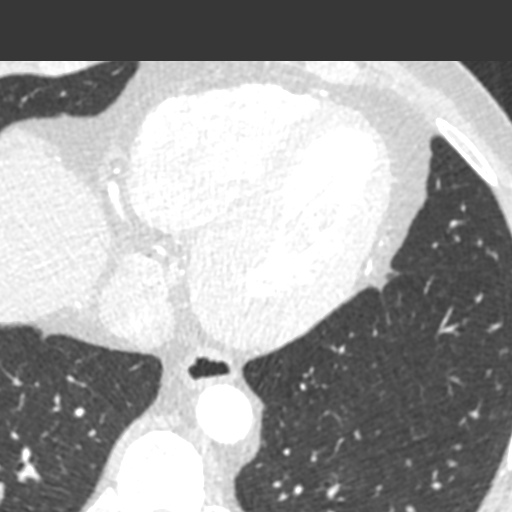
[im 268/402  vessel]
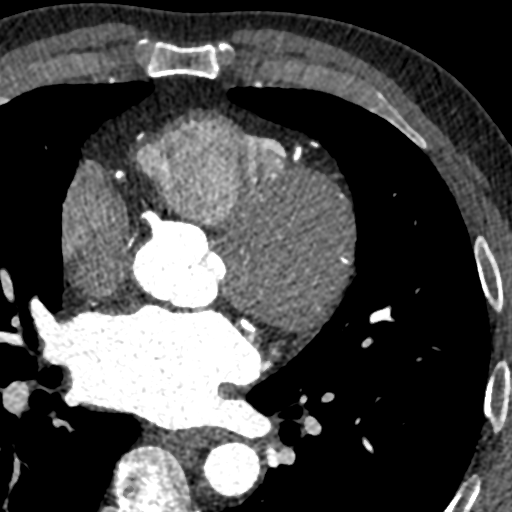

[Series 7: best syst 28 % · axial · 0.39mm/px · z∈[+1092,+1146]mm · 2 of 402 slices shown]
[im 134/402  vessel]
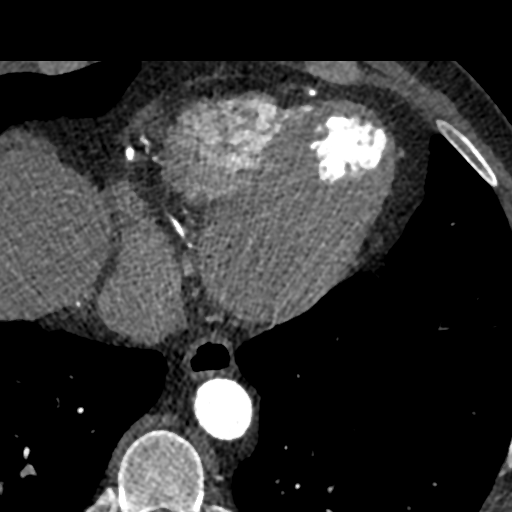
[im 268/402  vessel]
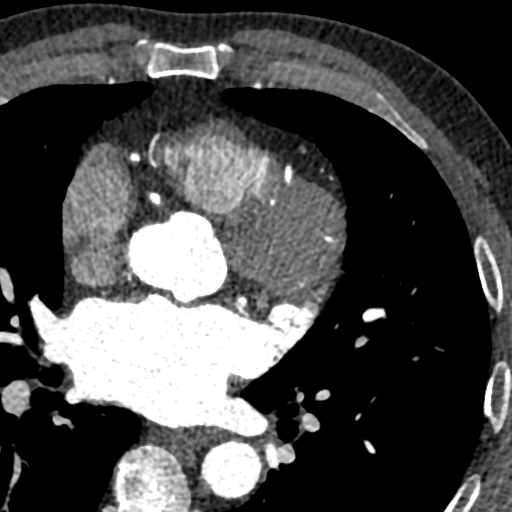

[Series 8: ts diast sharp 70 % · axial · 0.39mm/px · z∈[+1092,+1146]mm · 2 of 402 slices shown]
[im 134/402  lung]
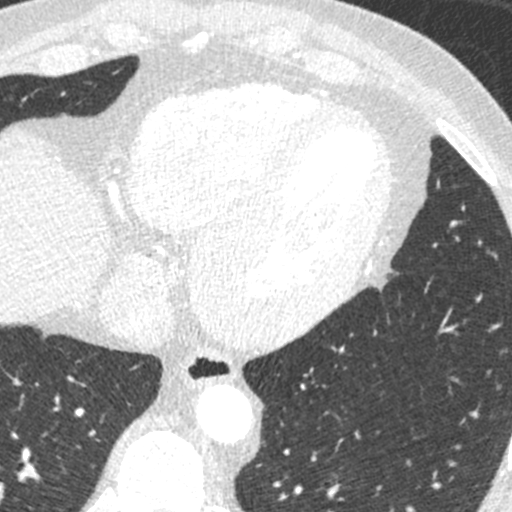
[im 268/402  lung]
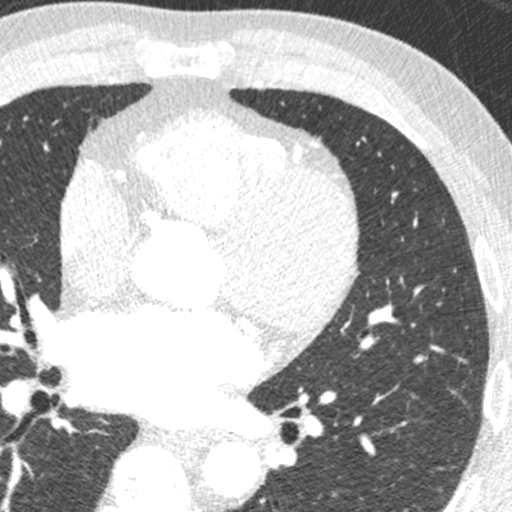

[Series 9: ts syst sharp 28 % · axial · 0.39mm/px · z∈[+1092,+1146]mm · 2 of 402 slices shown]
[im 134/402  lung]
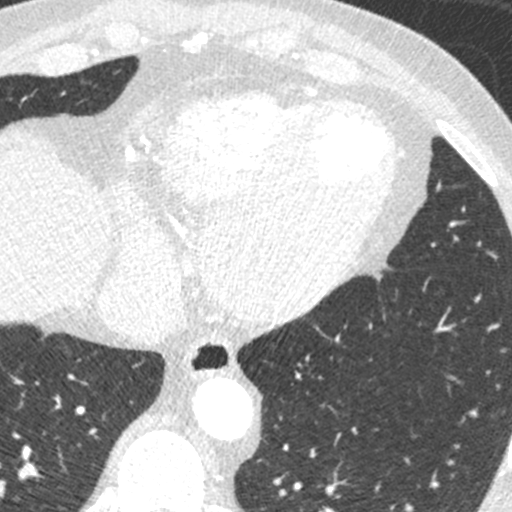
[im 268/402  lung]
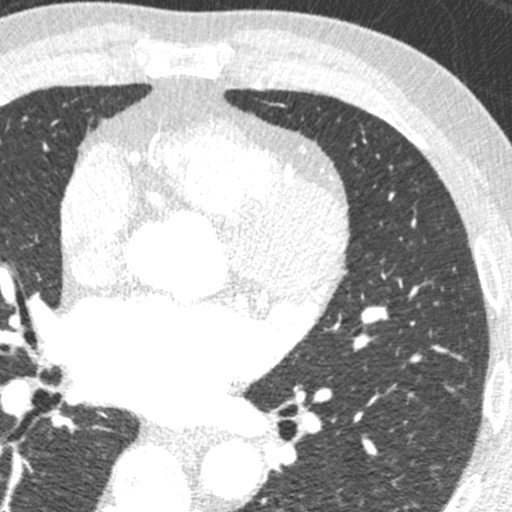

[8 of 20 positions shown; findings below may reference images not displayed]

FINDINGS: Mid RCA: FFR

Distal LAD: FFR

Distal LCx: FFR
IMPRESSION: No evidence for hemodynamically significant disease.

Kiyohide Iritani

## 2019-07-06 ENCOUNTER — Ambulatory Visit: Payer: Medicare Other | Admitting: Cardiology

## 2019-10-06 ENCOUNTER — Telehealth: Payer: Self-pay | Admitting: Cardiology

## 2019-10-06 NOTE — Telephone Encounter (Signed)

## 2019-10-07 ENCOUNTER — Ambulatory Visit: Payer: Medicare Other | Admitting: Cardiology

## 2019-10-07 ENCOUNTER — Telehealth: Payer: Self-pay | Admitting: Cardiology

## 2019-10-07 NOTE — Progress Notes (Deleted)
Clinical Summary Isaac Wilkerson is a 53 y.o.male seen today for follow up of the following medical problems.   1. Chronic systolic HF - Q000111Q echo LVEF 25-30%, abnormal diastolic function - Q000111Q Lexiscan: inferobasilar to mid inferior gut attenuation vs inferior infarct, no ischemia.  - from Dr Isaac Wilkerson notes plan was for trial of medical therapy, if LVEF not improved plan for cath at that time. - compliant with meds. Does not weight at home, though reports recent weight gain. From 04/2016 appt with Dr Isaac Wilkerson he is up 14 lbs.  CHF risk factors: father with history of CHF, brother with "weak heart", reports previous heavy EtOH abuse but has now stopped, previous crack usebut also stopped  -  repeated echo 08/2016, LVEF at Q000111Q, grade I diastolic dysfunction. - SOB a few days ago now resolved. No LE edema.  2. HTN  has not takeing meds yet today  3. Chest pain/CAD - admit 09/2018 with chest pain. Cocaine +, did have elevation of troponins peak 4.7. 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 - no recent chest pain, no SOB     Past Medical History:  Diagnosis Date  . Alcohol abuse   . CAD (coronary artery disease)   . Chronic combined systolic and diastolic CHF (congestive heart failure) ([place])   . Cocaine use   . Hypertension      No Known Allergies   Current Outpatient Medications  Medication Sig Dispense Refill  . albuterol (PROVENTIL HFA;VENTOLIN HFA) 108 (90 Base) MCG/ACT inhaler Inhale 1-2 puffs into the lungs every 6 (six) hours as needed for wheezing or shortness of breath.    Marland Kitchen albuterol (PROVENTIL) (2.5 MG/3ML) 0.083% nebulizer solution Inhale 2.5 mg into the lungs every 6  (six) hours as needed for wheezing or shortness of breath.     Marland Kitchen amLODipine (NORVASC) 10 MG tablet Take 10 mg by mouth daily.    Marland Kitchen aspirin EC 81 MG tablet Take 81 mg by mouth daily.    . furosemide (LASIX) 40 MG tablet Take 40 mg by mouth daily.     . isosorbide mononitrate (IMDUR) 60 MG 24 hr tablet Take 60 mg by mouth daily.     Marland Kitchen lisinopril (PRINIVIL,ZESTRIL) 40 MG tablet Take 40 mg by mouth daily.     . potassium chloride SA (K-DUR,KLOR-CON) 20 MEQ tablet Take 20 mEq by mouth daily.     . pravastatin (PRAVACHOL) 40 MG tablet Take 40 mg by mouth every evening.  0   No current facility-administered medications for this visit.      Past Surgical History:  Procedure Laterality Date  . EAR CYST EXCISION       No Known Allergies    Family History  Problem Relation Age of Onset  . Congestive Heart Failure Father   . CAD Father 80  . Cardiomyopathy Brother   . CAD Brother 52  . CAD Mother 72     Social History Isaac Wilkerson reports that he has been smoking. He has a 20.00 pack-year smoking history. He has never used smokeless tobacco. Isaac Wilkerson reports current alcohol use of about 3.0 standard drinks of alcohol per week.   Review of Systems CONSTITUTIONAL: No weight loss, fever, chills, weakness  or fatigue.  HEENT: Eyes: No visual loss, blurred vision, double vision or yellow sclerae.No hearing loss, sneezing, congestion, runny nose or sore throat.  SKIN: No rash or itching.  CARDIOVASCULAR:  RESPIRATORY: No shortness of breath, cough or sputum.  GASTROINTESTINAL: No anorexia, nausea, vomiting or diarrhea. No abdominal pain or blood.  GENITOURINARY: No burning on urination, no polyuria NEUROLOGICAL: No headache, dizziness, syncope, paralysis, ataxia, numbness or tingling in the extremities. No change in bowel or bladder control.  MUSCULOSKELETAL: No muscle, back pain, joint pain or stiffness.  LYMPHATICS: No enlarged nodes. No history of splenectomy.  PSYCHIATRIC: No  history of depression or anxiety.  ENDOCRINOLOGIC: No reports of sweating, cold or heat intolerance. No polyuria or polydipsia.  Marland Kitchen   Physical Examination There were no vitals filed for this visit. There were no vitals filed for this visit.  Gen: resting comfortably, no acute distress HEENT: no scleral icterus, pupils equal round and reactive, no palptable cervical adenopathy,  CV Resp: Clear to auscultation bilaterally GI: abdomen is soft, non-tender, non-distended, normal bowel sounds, no hepatosplenomegaly MSK: extremities are warm, no edema.  Skin: warm, no rash Neuro:  no focal deficits Psych: appropriate affect   Diagnostic Studies 08/2016 echo Study Conclusions  - Left ventricle: The cavity size was normal. Wall thickness was increased in a pattern of moderate LVH. Systolic function was vigorous. The estimated ejection fraction was in the range of 65% to 70%. Wall motion was normal; there were no regional wall motion abnormalities. Doppler parameters are consistent with abnormal left ventricular relaxation (grade 1 diastolic dysfunction). - Aortic valve: Valve area (VTI): 3.2 cm^2. Valve area (Vmax): 2.89 cm^2. Valve area (Vmean): 2.95 cm^2. - Technically adequate study.   Echocardiogram 09/28/18: Study Conclusions  - Left ventricle: The cavity size was normal. Wall thickness was increased in a pattern of mild LVH. There was mild concentric hypertrophy. Systolic function was normal. The estimated ejection fraction was in the range of 55% to 60%. There is hypokinesis of the apicalanterolateral and apical myocardium. Doppler parameters are consistent with abnormal left ventricular relaxation (grade 1 diastolic dysfunction).  Impressions:  - Apical hypokinesis with overall preserved LV function; mild LVH; mild diastolic dysfunction.  Coronary CTA 09/28/18: IMPRESSION: 1. Coronary artery calcium score 48 Agatston units. This  places the patient in the 75th percentile for age and gender, suggesting intermediate to high risk for future cardiac events.  2. 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 Will send for FFR to confirm hemodynamic significance.    Assessment and Plan  1. Chronic systolic HF - LVEF [id number], NYHA I. Repeat echo shows LVEF has now normalized, LVEF 65-70% -recent admit with elevated trop in setting of cocaine use, coronary CTA without significinat disease. He reports he has stopped his prior drug use - continue medical therapy.   2. HTN - midly elevated but has not taken meds yet today, continue to monitor.       Arnoldo Lenis, M.D.

## 2019-10-07 NOTE — Telephone Encounter (Signed)
Virtual Visit Pre-Appointment Phone Call  "(Name), I am calling you today to discuss your upcoming appointment. We are currently trying to limit exposure to the virus that causes COVID-19 by seeing patients at home rather than in the office."  1. "What is the BEST phone number to call the day of the visit?" - include this in appointment notes  2. Do you have or have access to (through a family member/friend) a smartphone with video capability that we can use for your visit?" a. If yes - list this number in appt notes as cell (if different from BEST phone #) and list the appointment type as a VIDEO visit in appointment notes b. If no - list the appointment type as a PHONE visit in appointment notes  3. Confirm consent - "In the setting of the current Covid19 crisis, you are scheduled for a (phone or video) visit with your provider on (date) at (time).  Just as we do with many in-office visits, in order for you to participate in this visit, we must obtain consent.  If you'd like, I can send this to your mychart (if signed up) or email for you to review.  Otherwise, I can obtain your verbal consent now.  All virtual visits are billed to your insurance company just like a normal visit would be.  By agreeing to a virtual visit, we'd like you to understand that the technology does not allow for your provider to perform an examination, and thus may limit your provider's ability to fully assess your condition. If your provider identifies any concerns that need to be evaluated in person, we will make arrangements to do so.  Finally, though the technology is pretty good, we cannot assure that it will always work on either your or our end, and in the setting of a video visit, we may have to convert it to a phone-only visit.  In either situation, we cannot ensure that we have a secure connection.  Are you willing to proceed?" STAFF: Did the patient verbally acknowledge consent to telehealth visit? Document  YES/NO here: yes  4. Advise patient to be prepared - "Two hours prior to your appointment, go ahead and check your blood pressure, pulse, oxygen saturation, and your weight (if you have the equipment to check those) and write them all down. When your visit starts, your provider will ask you for this information. If you have an Apple Watch or Kardia device, please plan to have heart rate information ready on the day of your appointment. Please have a pen and paper handy nearby the day of the visit as well."  5. Give patient instructions for MyChart download to smartphone OR Doximity/Doxy.me as below if video visit (depending on what platform provider is using)  6. Inform patient they will receive a phone call 15 minutes prior to their appointment time (may be from unknown caller ID) so they should be prepared to answer    TELEPHONE CALL NOTE  Gianlucca Lehrman has been deemed a candidate for a follow-up tele-health visit to limit community exposure during the Covid-19 pandemic. I spoke with the patient via phone to ensure availability of phone/video source, confirm preferred email & phone number, and discuss instructions and expectations.  I reminded Parley Rufenacht to be prepared with any vital sign and/or heart rhythm information that could potentially be obtained via home monitoring, at the time of his visit. I reminded Damar Charvet to expect a phone call prior to his visit.  Weston Anna 10/07/2019 9:45 AM   INSTRUCTIONS FOR DOWNLOADING THE MYCHART APP TO SMARTPHONE  - The patient must first make sure to have activated MyChart and know their login information - If Apple, go to [employer] and type in MyChart in the search bar and download the app. If Android, ask patient to go to [employer] and type in [place] in the search bar and download the app. The app is free but as with any other app downloads, their phone may require them to verify saved payment information or Apple/Android  password.  - The patient will need to then log into the app with their MyChart username and password, and select [facility] as their healthcare provider to link the account. When it is time for your visit, go to the MyChart app, find appointments, and click Begin Video Visit. Be sure to Select Allow for your device to access the Microphone and Camera for your visit. You will then be connected, and your provider will be with you shortly.  **If they have any issues connecting, or need assistance please contact MyChart service desk [phone])**  **If using a computer, in order to ensure the best quality for their visit they will need to use either of the following Internet Browsers: [employer], or Google Chrome**  IF USING DOXIMITY or DOXY.ME - The patient will receive a link just prior to their visit by text.     FULL LENGTH CONSENT FOR TELE-HEALTH VISIT   I hereby voluntarily request, consent and authorize [facility] and its employed or contracted physicians, physician assistants, nurse practitioners or other licensed health care professionals (the Practitioner), to provide me with telemedicine health care services (the Services") as deemed necessary by the treating Practitioner. I acknowledge and consent to receive the Services by the Practitioner via telemedicine. I understand that the telemedicine visit will involve communicating with the Practitioner through live audiovisual communication technology and the disclosure of certain medical information by electronic transmission. I acknowledge that I have been given the opportunity to request an in-person assessment or other available alternative prior to the telemedicine visit and am voluntarily participating in the telemedicine visit.  I understand that I have the right to withhold or withdraw my consent to the use of telemedicine in the course of my care at any time, without affecting my right to future care or treatment,  and that the Practitioner or I may terminate the telemedicine visit at any time. I understand that I have the right to inspect all information obtained and/or recorded in the course of the telemedicine visit and may receive copies of available information for a reasonable fee.  I understand that some of the potential risks of receiving the Services via telemedicine include:   Delay or interruption in medical evaluation due to technological equipment failure or disruption;  Information transmitted may not be sufficient (e.g. poor resolution of images) to allow for appropriate medical decision making by the Practitioner; and/or   In rare instances, security protocols could fail, causing a breach of personal health information.  Furthermore, I acknowledge that it is my responsibility to provide information about my medical history, conditions and care that is complete and accurate to the best of my ability. I acknowledge that Practitioner's advice, recommendations, and/or decision may be based on factors not within their control, such as incomplete or inaccurate data provided by me or distortions of diagnostic images or specimens that may result from electronic transmissions. I understand that the  practice of medicine is not an Chief Strategy Officer and that Practitioner makes no warranties or guarantees regarding treatment outcomes. I acknowledge that I will receive a copy of this consent concurrently upon execution via email to the email address I last provided but may also request a printed copy by calling the office of [facility].    I understand that my insurance will be billed for this visit.   I have read or had this consent read to me.  I understand the contents of this consent, which adequately explains the benefits and risks of the Services being provided via telemedicine.   I have been provided ample opportunity to ask questions regarding this consent and the Services and have had my questions  answered to my satisfaction.  I give my informed consent for the services to be provided through the use of telemedicine in my medical care  By participating in this telemedicine visit I agree to the above.

## 2019-11-02 ENCOUNTER — Encounter: Payer: Self-pay | Admitting: Cardiology

## 2019-11-02 ENCOUNTER — Telehealth (INDEPENDENT_AMBULATORY_CARE_PROVIDER_SITE_OTHER): Payer: Medicare Other | Admitting: Cardiology

## 2019-11-02 VITALS — BP 142/105 | HR 99 | Ht 66.0 in | Wt 205.0 lb

## 2019-11-02 DIAGNOSIS — I2510 Atherosclerotic heart disease of native coronary artery without angina pectoris: Secondary | ICD-10-CM

## 2019-11-02 DIAGNOSIS — I5022 Chronic systolic (congestive) heart failure: Secondary | ICD-10-CM | POA: Diagnosis not present

## 2019-11-02 DIAGNOSIS — I10 Essential (primary) hypertension: Secondary | ICD-10-CM

## 2019-11-02 MED ORDER — PRAVASTATIN SODIUM 40 MG PO TABS: 40.0000 mg | ORAL_TABLET | Freq: Every evening | ORAL | 3 refills | Status: DC

## 2019-11-02 MED ORDER — LISINOPRIL 40 MG PO TABS: 40.0000 mg | ORAL_TABLET | Freq: Every day | ORAL | 3 refills | Status: DC

## 2019-11-02 MED ORDER — POTASSIUM CHLORIDE CRYS ER 20 MEQ PO TBCR: 20.0000 meq | EXTENDED_RELEASE_TABLET | Freq: Every day | ORAL | 3 refills | Status: DC

## 2019-11-02 MED ORDER — FUROSEMIDE 40 MG PO TABS: 40.0000 mg | ORAL_TABLET | Freq: Every day | ORAL | 3 refills | Status: DC

## 2019-11-02 MED ORDER — ISOSORBIDE MONONITRATE ER 60 MG PO TB24: 60.0000 mg | ORAL_TABLET | Freq: Every day | ORAL | 3 refills | Status: DC

## 2019-11-02 MED ORDER — AMLODIPINE BESYLATE 10 MG PO TABS: 10.0000 mg | ORAL_TABLET | Freq: Every day | ORAL | 3 refills | Status: DC

## 2019-11-02 NOTE — Progress Notes (Signed)
Virtual Visit via Telephone Note   This visit type was conducted due to national recommendations for restrictions regarding the COVID-19 Pandemic (e.g. social distancing) in an effort to limit this patient's exposure and mitigate transmission in our community.  Due to his co-morbid illnesses, this patient is at least at moderate risk for complications without adequate follow up.  This format is felt to be most appropriate for this patient at this time.  The patient did not have access to video technology/had technical difficulties with video requiring transitioning to audio format only (telephone).  All issues noted in this document were discussed and addressed.  No physical exam could be performed with this format.  Please refer to the patient's chart for his  consent to telehealth for [facility].   Date:  11/02/2019   ID:  Isaac Wilkerson, DOB July 29, 1966, MRN [id number]  Patient Location: Home Provider Location: Office  PCP:  Neale Burly, MD  Cardiologist:  Carlyle Dolly, MD  Electrophysiologist:  None   Evaluation Performed:  Follow-Up Visit  Chief Complaint:  Follow up visit  History of Present Illness:    Isaac Wilkerson is a 53 y.o. male seen today for follow up of the following medical problems.   1. Chronic systolic HF - Q000111Q echo LVEF 25-30%, abnormal diastolic function - Q000111Q Lexiscan: inferobasilar to mid inferior gut attenuation vs inferior infarct, no ischemia.  - from Dr Dion Body notes plan was for trial of medical therapy, if LVEF not improved plan for cath at that time. CHF risk factors: father with history of CHF, brother with "weak heart", reports previous heavy EtOH abuse but has now stopped, previous crack usebut also stopped  -  repeated echo 08/2016, LVEF at Q000111Q, grade I diastolic dysfunction.  - no recent SOB. Walks 2-3 miles daily without troubles - no recent edema  2. HTN -ran out of meds just 2 days ago  3. CAD - admit 09/2018  with chest pain. Cocaine +, did have elevation of troponins peak 4.7. 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    Past Medical History:  Diagnosis Date  . Alcohol abuse   . CAD (coronary artery disease)   . Chronic combined systolic and diastolic CHF (congestive heart failure) ([place])   . Cocaine use   . Hypertension    Past Surgical History:  Procedure Laterality Date  . EAR CYST EXCISION       No outpatient medications have been marked as taking for the 11/02/19 encounter (Appointment) with Arnoldo Lenis, MD.     Allergies:   Patient has no known allergies.   Social History   Tobacco Use  . Smoking status: Current Every Day Smoker    Packs/day: 0.50    Years: 40.00    Pack years: 20.00    Last attempt to quit: 10/12/2016    Years since quitting: 3.0  . Smokeless tobacco: Never Used  . Tobacco comment: Only smokes when he drinks  Substance Use Topics  . Alcohol use: Yes    Alcohol/week: 3.0 standard drinks    Types: 3 Cans of beer per week  Comment: Previously listed as heavy, currently 3 beers 3x/week  . Drug use: Yes    Types: Cocaine, Marijuana    Comment: every so often     Family Hx: The patient's family history includes CAD (age of onset: 80) in his brother; CAD (age of onset: 69) in his father; CAD (age of onset: 17) in his mother; Cardiomyopathy in his brother; Congestive Heart Failure in his father.  ROS:   Please see the history of present illness.     All other systems reviewed and are negative.   Prior CV studies:   The following studies were reviewed  today:  08/2016 echo Study Conclusions  - Left ventricle: The cavity size was normal. Wall thickness was increased in a pattern of moderate LVH. Systolic function was vigorous. The estimated ejection fraction was in the range of 65% to 70%. Wall motion was normal; there were no regional wall motion abnormalities. Doppler parameters are consistent with abnormal left ventricular relaxation (grade 1 diastolic dysfunction). - Aortic valve: Valve area (VTI): 3.2 cm^2. Valve area (Vmax): 2.89 cm^2. Valve area (Vmean): 2.95 cm^2. - Technically adequate study.   Echocardiogram 09/28/18: Study Conclusions  - Left ventricle: The cavity size was normal. Wall thickness was increased in a pattern of mild LVH. There was mild concentric hypertrophy. Systolic function was normal. The estimated ejection fraction was in the range of 55% to 60%. There is hypokinesis of the apicalanterolateral and apical myocardium. Doppler parameters are consistent with abnormal left ventricular relaxation (grade 1 diastolic dysfunction).  Impressions:  - Apical hypokinesis with overall preserved LV function; mild LVH; mild diastolic dysfunction.  Coronary CTA 09/28/18: IMPRESSION: 1. Coronary artery calcium score 48 Agatston units. This places the patient in the 75th percentile for age and gender, suggesting intermediate to high risk for future cardiac events.  2. 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 Will send for FFR to confirm hemodynamic significance.  Labs/Other Tests and Data Reviewed:    EKG:  No ECG reviewed.  Recent Labs: No results found for requested labs within last 8760 hours.   Recent Lipid Panel Lab Results  Component Value Date/Time   CHOL 182 09/28/2018 03:13 AM   TRIG 269 (H) 09/28/2018 03:13 AM   HDL 51 09/28/2018 03:13 AM   CHOLHDL 3.6 09/28/2018 03:13 AM   LDLCALC 77 09/28/2018 03:13 AM    Wt Readings from Last 3 Encounters:  01/05/19  204 lb 6.4 oz (92.7 kg)  09/28/18 184 lb 14.4 oz (83.9 kg)  10/14/16 208 lb (94.3 kg)     Objective:    Vital Signs:   Today's Vitals   11/02/19 1229  BP: (!) 142/105  Pulse: 99  Weight: 205 lb (93 kg)  Height: 5\' 6"  (1.676 m)   Body mass index is 33.09 kg/m. Normal affect. Normal speech pattern and tone. Comforatble, no apparent distress. NO audible signs of SOB or wheezing.   ASSESSMENT & PLAN:    1. Chronic systolic HF - LVEF [id number], NYHA I. Repeat echo shows LVEF has now normalized, LVEF 65-70% -no recent symptoms, continue current meds  2. HTN -elevated but ran out of meds, refill meds and follow b/p  3. CAD - no recent symptoms -continue medical therapy.    F/u 6 months in clinic     COVID-19 Education: The signs and symptoms of COVID-19 were discussed with the patient and how to seek care for testing (follow up with PCP or arrange E-visit).  The importance of social distancing was discussed  today.  Time:   Today, I have spent 14 minutes with the patient with telehealth technology discussing the above problems.     Medication Adjustments/Labs and Tests Ordered: Current medicines are reviewed at length with the patient today.  Concerns regarding medicines are outlined above.   Tests Ordered: No orders of the defined types were placed in this encounter.   Medication Changes: No orders of the defined types were placed in this encounter.   Follow Up:  In Person in 6 month(s)  Signed, Carlyle Dolly, MD  11/02/2019 12:22 PM    [facility]

## 2019-11-02 NOTE — Addendum Note (Signed)
Addended by: Debbora Lacrosse R on: 11/02/2019 02:01 PM   Modules accepted: Orders

## 2019-11-02 NOTE — Patient Instructions (Signed)

## 2020-02-24 ENCOUNTER — Other Ambulatory Visit: Payer: Self-pay

## 2020-02-24 MED ORDER — PRAVASTATIN SODIUM 40 MG PO TABS: 40.0000 mg | ORAL_TABLET | Freq: Every evening | ORAL | 3 refills | Status: DC

## 2020-02-24 MED ORDER — LISINOPRIL 40 MG PO TABS: 40.0000 mg | ORAL_TABLET | Freq: Every day | ORAL | 3 refills | Status: DC

## 2020-02-24 NOTE — Telephone Encounter (Signed)
Refilled lisinopril and pravachol to mail order

## 2020-04-27 ENCOUNTER — Telehealth: Payer: Self-pay

## 2020-04-27 ENCOUNTER — Other Ambulatory Visit: Payer: Self-pay | Admitting: Cardiology

## 2020-04-27 MED ORDER — LISINOPRIL 40 MG PO TABS: 40.0000 mg | ORAL_TABLET | Freq: Every day | ORAL | 3 refills | Status: DC

## 2020-04-27 NOTE — Telephone Encounter (Signed)
pcp had ordered benazapril, [facility] pharmacy will d/c.Pt takes lisinopril 40 mg from [facility]

## 2020-04-27 NOTE — Telephone Encounter (Signed)
-----   Message from Arnoldo Lenis, MD sent at 04/27/2020  1:03 PM EDT ----- Regarding: RE: clarify med Please clarify with his pharmacy, from my understanding was on lisinopril as well. Either way if on lisinopril or lotensin aki benazepril, would refill which ever one he is on. Please clarify he is not getting both  Zandra Abts MD ----- Message ----- From: Bernita Raisin, RN Sent: 04/27/2020  12:43 PM EDT To: Arnoldo Lenis, MD Subject: clarify med                                    Wants refill for lotensin, I see only lisinopril

## 2020-04-30 DIAGNOSIS — I10 Essential (primary) hypertension: Secondary | ICD-10-CM | POA: Diagnosis not present

## 2020-04-30 DIAGNOSIS — J4541 Moderate persistent asthma with (acute) exacerbation: Secondary | ICD-10-CM | POA: Diagnosis not present

## 2020-04-30 DIAGNOSIS — I2510 Atherosclerotic heart disease of native coronary artery without angina pectoris: Secondary | ICD-10-CM | POA: Diagnosis not present

## 2020-05-14 ENCOUNTER — Ambulatory Visit: Payer: Medicare Other | Admitting: Cardiology

## 2020-05-30 ENCOUNTER — Encounter: Payer: Self-pay | Admitting: Cardiology

## 2020-05-30 ENCOUNTER — Encounter: Payer: Self-pay | Admitting: *Deleted

## 2020-05-30 ENCOUNTER — Ambulatory Visit (INDEPENDENT_AMBULATORY_CARE_PROVIDER_SITE_OTHER): Payer: Medicare Other | Admitting: Cardiology

## 2020-05-30 ENCOUNTER — Other Ambulatory Visit: Payer: Self-pay

## 2020-05-30 VITALS — BP 160/98 | HR 91 | Ht 66.0 in | Wt 216.6 lb

## 2020-05-30 DIAGNOSIS — Z8679 Personal history of other diseases of the circulatory system: Secondary | ICD-10-CM | POA: Diagnosis not present

## 2020-05-30 DIAGNOSIS — I10 Essential (primary) hypertension: Secondary | ICD-10-CM

## 2020-05-30 DIAGNOSIS — I2510 Atherosclerotic heart disease of native coronary artery without angina pectoris: Secondary | ICD-10-CM

## 2020-05-30 NOTE — Patient Instructions (Signed)

## 2020-05-30 NOTE — Progress Notes (Signed)
Clinical Summary Isaac Wilkerson is a 54 y.o.male seen today for follow up of the following medical problems.  1. Chronic systolic HF - 08/4173 echo LVEF 25-30%, abnormal diastolic function - 0/8144 Lexiscan: inferobasilar to mid inferior gut attenuation vs inferior infarct, no ischemia.  - from Dr Isaac Wilkerson notes plan was for trial of medical therapy, if LVEF not improved plan for cath at that time. CHF risk factors: father with history of CHF, brother with "weak heart", reports previous heavy EtOH abuse but has now stopped, previous crack usebut also stopped  - repeated echo 08/2016, LVEF at 81-85%, grade I diastolic dysfunction. 09/2018 LVEF 55-60%     -  Walked 4 miles the other day well above his normal level of exertion, had some SOB and soreness after. No symptoms with his regular levels of activity - compliant with meds    2. HTN - has not taken meds yet today  3. CAD - admit 09/2018 with chest pain. Cocaine +, did have elevation of troponins peak 4.7. 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 Alcohol abuse   . CAD (coronary artery disease)   . Chronic combined systolic and diastolic CHF (congestive heart failure) ([place])   . Cocaine use   . Hypertension      No Known Allergies   Current Outpatient Medications  Medication Sig Dispense Refill  . albuterol (PROVENTIL HFA;VENTOLIN HFA) 108 (90 Base) MCG/ACT inhaler Inhale 1-2 puffs into the lungs every 6 (six) hours as needed for wheezing or shortness of breath.    Marland Kitchen albuterol (PROVENTIL) (2.5 MG/3ML) 0.083% nebulizer solution Inhale 2.5 mg into the lungs every 6 (six)  hours as needed for wheezing or shortness of breath.     Marland Kitchen amLODipine (NORVASC) 10 MG tablet Take 1 tablet (10 mg total) by mouth daily. 90 tablet 3  . aspirin EC 81 MG tablet Take 81 mg by mouth daily.    . furosemide (LASIX) 40 MG tablet Take 1 tablet (40 mg total) by mouth daily. 90 tablet 3  . isosorbide mononitrate (IMDUR) 60 MG 24 hr tablet Take 1 tablet (60 mg total) by mouth daily. 90 tablet 3  . lisinopril (ZESTRIL) 40 MG tablet Take 1 tablet (40 mg total) by mouth daily. 90 tablet 3  . potassium chloride SA (KLOR-CON) 20 MEQ tablet Take 1 tablet (20 mEq total) by mouth daily. 90 tablet 3  . pravastatin (PRAVACHOL) 40 MG tablet Take 1 tablet (40 mg total) by mouth every evening. 90 tablet 3   No current facility-administered medications for this visit.     Past Surgical History:  Procedure Laterality Date  . EAR CYST EXCISION       No Known Allergies    Family History  Problem Relation Age of Onset  . Congestive Heart Failure Father   . CAD Father 94  . Cardiomyopathy Brother   . CAD Brother 79  . CAD Mother 43     Social History Isaac Wilkerson reports that he has been smoking. He has a 20.00 pack-year smoking history. He has never used smokeless tobacco. Isaac Wilkerson reports current alcohol use of about 3.0  standard drinks of alcohol per week.   Review of Systems CONSTITUTIONAL: No weight loss, fever, chills, weakness or fatigue.  HEENT: Eyes: No visual loss, blurred vision, double vision or yellow sclerae.No hearing loss, sneezing, congestion, runny nose or sore throat.  SKIN: No rash or itching.  CARDIOVASCULAR: per hpi RESPIRATORY: No shortness of breath, cough or sputum.  GASTROINTESTINAL: No anorexia, nausea, vomiting or diarrhea. No abdominal pain or blood.  GENITOURINARY: No burning on urination, no polyuria NEUROLOGICAL: No headache, dizziness, syncope, paralysis, ataxia, numbness or tingling in the extremities. No change in bowel or bladder control.    MUSCULOSKELETAL: No muscle, back pain, joint pain or stiffness.  LYMPHATICS: No enlarged nodes. No history of splenectomy.  PSYCHIATRIC: No history of depression or anxiety.  ENDOCRINOLOGIC: No reports of sweating, cold or heat intolerance. No polyuria or polydipsia.  Marland Kitchen   Physical Examination Today's Vitals   05/30/20 1013  BP: (!) 160/98  Pulse: 91  SpO2: 96%  Weight: 216 lb 9.6 oz (98.2 kg)  Height: 5\' 6"  (1.676 m)   Wilkerson mass index is 34.96 kg/m.  Gen: resting comfortably, no acute distress HEENT: no scleral icterus, pupils equal round and reactive, no palptable cervical adenopathy,  CV: RRR, no m/r/g, no jvd Resp: Clear to auscultation bilaterally GI: abdomen is soft, non-tender, non-distended, normal bowel sounds, no hepatosplenomegaly MSK: extremities are warm, no edema.  Skin: warm, no rash Neuro:  no focal deficits Psych: appropriate affect   Diagnostic Studies  08/2016 echo Study Conclusions  - Left ventricle: The cavity size was normal. Wall thickness was increased in a pattern of moderate LVH. Systolic function was vigorous. The estimated ejection fraction was in the range of 65% to 70%. Wall motion was normal; there were no regional wall motion abnormalities. Doppler parameters are consistent with abnormal left ventricular relaxation (grade 1 diastolic dysfunction). - Aortic valve: Valve area (VTI): 3.2 cm^2. Valve area (Vmax): 2.89 cm^2. Valve area (Vmean): 2.95 cm^2. - Technically adequate study.   Echocardiogram 09/28/18: Study Conclusions  - Left ventricle: The cavity size was normal. Wall thickness was increased in a pattern of mild LVH. There was mild concentric hypertrophy. Systolic function was normal. The estimated ejection fraction was in the range of 55% to 60%. There is hypokinesis of the apicalanterolateral and apical myocardium. Doppler parameters are consistent with abnormal left ventricular relaxation  (grade 1 diastolic dysfunction).  Impressions:  - Apical hypokinesis with overall preserved LV function; mild LVH; mild diastolic dysfunction.  Coronary CTA 09/28/18: IMPRESSION: 1. Coronary artery calcium score 48 Agatston units. This places the patient in the 75th percentile for age and gender, suggesting intermediate to high risk for future cardiac events.  2. 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 Will send for FFR to confirm hemodynamic significance.   Assessment and Plan  1. History of cardiomyopathy - LVEF has since normalized, no recent symptoms - continue current meds  2. HTN - bp elevated but has not taken meds yet, continue to monitor  3. CAD - no recent symptoms, continue to monitor EKG today shows SR, rare PVCs   Labs followed by pcp      Arnoldo Lenis, M.D.,

## 2020-07-31 DIAGNOSIS — Z23 Encounter for immunization: Secondary | ICD-10-CM | POA: Diagnosis not present

## 2020-08-23 ENCOUNTER — Other Ambulatory Visit: Payer: Self-pay

## 2020-08-23 MED ORDER — AMLODIPINE BESYLATE 10 MG PO TABS: 10.0000 mg | ORAL_TABLET | Freq: Every day | ORAL | 3 refills | Status: DC

## 2020-08-23 NOTE — Telephone Encounter (Signed)
Refilled amlodipine 

## 2020-09-10 DIAGNOSIS — Z23 Encounter for immunization: Secondary | ICD-10-CM | POA: Diagnosis not present

## 2020-12-31 DIAGNOSIS — J4541 Moderate persistent asthma with (acute) exacerbation: Secondary | ICD-10-CM | POA: Diagnosis not present

## 2020-12-31 DIAGNOSIS — I10 Essential (primary) hypertension: Secondary | ICD-10-CM | POA: Diagnosis not present

## 2020-12-31 DIAGNOSIS — Z0000 Encounter for general adult medical examination without abnormal findings: Secondary | ICD-10-CM | POA: Diagnosis not present

## 2020-12-31 DIAGNOSIS — J309 Allergic rhinitis, unspecified: Secondary | ICD-10-CM | POA: Diagnosis not present

## 2020-12-31 DIAGNOSIS — I2510 Atherosclerotic heart disease of native coronary artery without angina pectoris: Secondary | ICD-10-CM | POA: Diagnosis not present

## 2020-12-31 DIAGNOSIS — Z1331 Encounter for screening for depression: Secondary | ICD-10-CM | POA: Diagnosis not present

## 2020-12-31 DIAGNOSIS — Z131 Encounter for screening for diabetes mellitus: Secondary | ICD-10-CM | POA: Diagnosis not present

## 2020-12-31 DIAGNOSIS — Z125 Encounter for screening for malignant neoplasm of prostate: Secondary | ICD-10-CM | POA: Diagnosis not present

## 2021-01-17 DIAGNOSIS — R079 Chest pain, unspecified: Secondary | ICD-10-CM | POA: Diagnosis not present

## 2021-01-17 DIAGNOSIS — R0789 Other chest pain: Secondary | ICD-10-CM | POA: Diagnosis not present

## 2021-01-17 DIAGNOSIS — R000 Tachycardia, unspecified: Secondary | ICD-10-CM | POA: Diagnosis not present

## 2021-01-17 DIAGNOSIS — I10 Essential (primary) hypertension: Secondary | ICD-10-CM | POA: Diagnosis not present

## 2021-04-04 DIAGNOSIS — I10 Essential (primary) hypertension: Secondary | ICD-10-CM | POA: Diagnosis not present

## 2021-04-04 DIAGNOSIS — I2510 Atherosclerotic heart disease of native coronary artery without angina pectoris: Secondary | ICD-10-CM | POA: Diagnosis not present

## 2021-04-04 DIAGNOSIS — J4541 Moderate persistent asthma with (acute) exacerbation: Secondary | ICD-10-CM | POA: Diagnosis not present

## 2021-04-04 DIAGNOSIS — J309 Allergic rhinitis, unspecified: Secondary | ICD-10-CM | POA: Diagnosis not present

## 2021-04-28 ENCOUNTER — Other Ambulatory Visit: Payer: Self-pay | Admitting: Cardiology

## 2021-05-23 DIAGNOSIS — J0101 Acute recurrent maxillary sinusitis: Secondary | ICD-10-CM | POA: Diagnosis not present

## 2021-05-23 DIAGNOSIS — I10 Essential (primary) hypertension: Secondary | ICD-10-CM | POA: Diagnosis not present

## 2021-05-23 DIAGNOSIS — I2510 Atherosclerotic heart disease of native coronary artery without angina pectoris: Secondary | ICD-10-CM | POA: Diagnosis not present

## 2021-05-23 DIAGNOSIS — D473 Essential (hemorrhagic) thrombocythemia: Secondary | ICD-10-CM | POA: Diagnosis not present

## 2021-05-23 DIAGNOSIS — J4541 Moderate persistent asthma with (acute) exacerbation: Secondary | ICD-10-CM | POA: Diagnosis not present

## 2021-09-23 DIAGNOSIS — J4541 Moderate persistent asthma with (acute) exacerbation: Secondary | ICD-10-CM | POA: Diagnosis not present

## 2021-09-23 DIAGNOSIS — H11432 Conjunctival hyperemia, left eye: Secondary | ICD-10-CM | POA: Diagnosis not present

## 2021-09-23 DIAGNOSIS — J0101 Acute recurrent maxillary sinusitis: Secondary | ICD-10-CM | POA: Diagnosis not present

## 2021-09-23 DIAGNOSIS — R0981 Nasal congestion: Secondary | ICD-10-CM | POA: Diagnosis not present

## 2021-09-23 DIAGNOSIS — I10 Essential (primary) hypertension: Secondary | ICD-10-CM | POA: Diagnosis not present

## 2021-09-23 DIAGNOSIS — D473 Essential (hemorrhagic) thrombocythemia: Secondary | ICD-10-CM | POA: Diagnosis not present

## 2021-09-23 DIAGNOSIS — I2510 Atherosclerotic heart disease of native coronary artery without angina pectoris: Secondary | ICD-10-CM | POA: Diagnosis not present

## 2021-12-31 DIAGNOSIS — I10 Essential (primary) hypertension: Secondary | ICD-10-CM | POA: Diagnosis not present

## 2021-12-31 DIAGNOSIS — J309 Allergic rhinitis, unspecified: Secondary | ICD-10-CM | POA: Diagnosis not present

## 2021-12-31 DIAGNOSIS — J4541 Moderate persistent asthma with (acute) exacerbation: Secondary | ICD-10-CM | POA: Diagnosis not present

## 2021-12-31 DIAGNOSIS — I2510 Atherosclerotic heart disease of native coronary artery without angina pectoris: Secondary | ICD-10-CM | POA: Diagnosis not present

## 2022-04-07 DIAGNOSIS — J309 Allergic rhinitis, unspecified: Secondary | ICD-10-CM | POA: Diagnosis not present

## 2022-04-07 DIAGNOSIS — I2510 Atherosclerotic heart disease of native coronary artery without angina pectoris: Secondary | ICD-10-CM | POA: Diagnosis not present

## 2022-04-07 DIAGNOSIS — I10 Essential (primary) hypertension: Secondary | ICD-10-CM | POA: Diagnosis not present

## 2022-04-07 DIAGNOSIS — Z0000 Encounter for general adult medical examination without abnormal findings: Secondary | ICD-10-CM | POA: Diagnosis not present

## 2022-04-07 DIAGNOSIS — J4541 Moderate persistent asthma with (acute) exacerbation: Secondary | ICD-10-CM | POA: Diagnosis not present

## 2022-04-07 DIAGNOSIS — J029 Acute pharyngitis, unspecified: Secondary | ICD-10-CM | POA: Diagnosis not present

## 2022-04-07 DIAGNOSIS — Z125 Encounter for screening for malignant neoplasm of prostate: Secondary | ICD-10-CM | POA: Diagnosis not present

## 2022-04-07 DIAGNOSIS — Z1331 Encounter for screening for depression: Secondary | ICD-10-CM | POA: Diagnosis not present

## 2022-04-07 DIAGNOSIS — H60509 Unspecified acute noninfective otitis externa, unspecified ear: Secondary | ICD-10-CM | POA: Diagnosis not present

## 2022-04-10 ENCOUNTER — Encounter (INDEPENDENT_AMBULATORY_CARE_PROVIDER_SITE_OTHER): Payer: Self-pay | Admitting: *Deleted

## 2022-09-23 ENCOUNTER — Encounter (INDEPENDENT_AMBULATORY_CARE_PROVIDER_SITE_OTHER): Payer: Self-pay | Admitting: *Deleted

## 2022-10-06 DIAGNOSIS — L0291 Cutaneous abscess, unspecified: Secondary | ICD-10-CM | POA: Diagnosis not present

## 2023-03-09 DIAGNOSIS — F329 Major depressive disorder, single episode, unspecified: Secondary | ICD-10-CM | POA: Diagnosis not present

## 2023-04-06 DIAGNOSIS — F329 Major depressive disorder, single episode, unspecified: Secondary | ICD-10-CM | POA: Diagnosis not present

## 2023-04-20 DIAGNOSIS — F329 Major depressive disorder, single episode, unspecified: Secondary | ICD-10-CM | POA: Diagnosis not present

## 2023-08-17 ENCOUNTER — Encounter: Payer: Self-pay | Admitting: Cardiology

## 2023-08-17 DIAGNOSIS — J45909 Unspecified asthma, uncomplicated: Secondary | ICD-10-CM | POA: Diagnosis not present

## 2023-08-17 DIAGNOSIS — J90 Pleural effusion, not elsewhere classified: Secondary | ICD-10-CM | POA: Diagnosis not present

## 2023-08-17 DIAGNOSIS — Z20822 Contact with and (suspected) exposure to covid-19: Secondary | ICD-10-CM | POA: Diagnosis not present

## 2023-08-17 DIAGNOSIS — I2510 Atherosclerotic heart disease of native coronary artery without angina pectoris: Secondary | ICD-10-CM | POA: Diagnosis not present

## 2023-08-17 DIAGNOSIS — R079 Chest pain, unspecified: Secondary | ICD-10-CM | POA: Diagnosis not present

## 2023-08-17 DIAGNOSIS — F17210 Nicotine dependence, cigarettes, uncomplicated: Secondary | ICD-10-CM | POA: Diagnosis not present

## 2023-08-17 DIAGNOSIS — R9431 Abnormal electrocardiogram [ECG] [EKG]: Secondary | ICD-10-CM | POA: Diagnosis not present

## 2023-12-17 DIAGNOSIS — R079 Chest pain, unspecified: Secondary | ICD-10-CM | POA: Diagnosis not present

## 2023-12-17 DIAGNOSIS — I5033 Acute on chronic diastolic (congestive) heart failure: Secondary | ICD-10-CM | POA: Diagnosis not present

## 2023-12-17 DIAGNOSIS — R0602 Shortness of breath: Secondary | ICD-10-CM | POA: Diagnosis not present

## 2023-12-17 DIAGNOSIS — Z79899 Other long term (current) drug therapy: Secondary | ICD-10-CM | POA: Diagnosis not present

## 2023-12-17 DIAGNOSIS — I10 Essential (primary) hypertension: Secondary | ICD-10-CM | POA: Diagnosis not present

## 2023-12-17 DIAGNOSIS — I429 Cardiomyopathy, unspecified: Secondary | ICD-10-CM | POA: Diagnosis not present

## 2023-12-17 DIAGNOSIS — Z630 Problems in relationship with spouse or partner: Secondary | ICD-10-CM | POA: Diagnosis not present

## 2023-12-17 DIAGNOSIS — I509 Heart failure, unspecified: Secondary | ICD-10-CM | POA: Diagnosis not present

## 2023-12-17 DIAGNOSIS — I517 Cardiomegaly: Secondary | ICD-10-CM | POA: Diagnosis not present

## 2023-12-17 DIAGNOSIS — J209 Acute bronchitis, unspecified: Secondary | ICD-10-CM | POA: Diagnosis not present

## 2023-12-17 DIAGNOSIS — Z91148 Patient's other noncompliance with medication regimen for other reason: Secondary | ICD-10-CM | POA: Insufficient documentation

## 2023-12-17 DIAGNOSIS — R0989 Other specified symptoms and signs involving the circulatory and respiratory systems: Secondary | ICD-10-CM | POA: Diagnosis not present

## 2023-12-17 DIAGNOSIS — I252 Old myocardial infarction: Secondary | ICD-10-CM | POA: Diagnosis not present

## 2023-12-17 DIAGNOSIS — F1010 Alcohol abuse, uncomplicated: Secondary | ICD-10-CM | POA: Insufficient documentation

## 2023-12-17 DIAGNOSIS — J9801 Acute bronchospasm: Secondary | ICD-10-CM | POA: Diagnosis not present

## 2023-12-17 DIAGNOSIS — J90 Pleural effusion, not elsewhere classified: Secondary | ICD-10-CM | POA: Diagnosis not present

## 2023-12-17 DIAGNOSIS — Z91198 Patient's noncompliance with other medical treatment and regimen for other reason: Secondary | ICD-10-CM | POA: Diagnosis not present

## 2023-12-17 DIAGNOSIS — Z20822 Contact with and (suspected) exposure to covid-19: Secondary | ICD-10-CM | POA: Diagnosis not present

## 2023-12-17 DIAGNOSIS — J449 Chronic obstructive pulmonary disease, unspecified: Secondary | ICD-10-CM | POA: Diagnosis not present

## 2023-12-17 DIAGNOSIS — I110 Hypertensive heart disease with heart failure: Secondary | ICD-10-CM | POA: Diagnosis not present

## 2023-12-18 DIAGNOSIS — R0602 Shortness of breath: Secondary | ICD-10-CM | POA: Diagnosis not present

## 2023-12-18 DIAGNOSIS — Z91199 Patient's noncompliance with other medical treatment and regimen due to unspecified reason: Secondary | ICD-10-CM | POA: Diagnosis not present

## 2023-12-18 DIAGNOSIS — I509 Heart failure, unspecified: Secondary | ICD-10-CM | POA: Diagnosis not present

## 2023-12-18 DIAGNOSIS — Z630 Problems in relationship with spouse or partner: Secondary | ICD-10-CM | POA: Diagnosis not present

## 2023-12-18 DIAGNOSIS — F1010 Alcohol abuse, uncomplicated: Secondary | ICD-10-CM | POA: Diagnosis not present

## 2023-12-18 DIAGNOSIS — I517 Cardiomegaly: Secondary | ICD-10-CM | POA: Diagnosis not present

## 2023-12-18 DIAGNOSIS — J811 Chronic pulmonary edema: Secondary | ICD-10-CM | POA: Diagnosis not present

## 2023-12-18 DIAGNOSIS — J90 Pleural effusion, not elsewhere classified: Secondary | ICD-10-CM | POA: Diagnosis not present

## 2024-02-18 ENCOUNTER — Encounter: Payer: Self-pay | Admitting: Internal Medicine

## 2024-02-18 ENCOUNTER — Other Ambulatory Visit: Payer: Self-pay | Admitting: Internal Medicine

## 2024-02-18 ENCOUNTER — Encounter (HOSPITAL_COMMUNITY): Payer: Self-pay

## 2024-02-18 DIAGNOSIS — R918 Other nonspecific abnormal finding of lung field: Secondary | ICD-10-CM | POA: Diagnosis not present

## 2024-02-18 DIAGNOSIS — F1910 Other psychoactive substance abuse, uncomplicated: Secondary | ICD-10-CM | POA: Diagnosis not present

## 2024-02-18 DIAGNOSIS — R0989 Other specified symptoms and signs involving the circulatory and respiratory systems: Secondary | ICD-10-CM | POA: Diagnosis not present

## 2024-02-18 DIAGNOSIS — Z5329 Procedure and treatment not carried out because of patient's decision for other reasons: Secondary | ICD-10-CM | POA: Diagnosis not present

## 2024-02-18 DIAGNOSIS — I509 Heart failure, unspecified: Secondary | ICD-10-CM | POA: Diagnosis not present

## 2024-02-18 DIAGNOSIS — I517 Cardiomegaly: Secondary | ICD-10-CM | POA: Diagnosis not present

## 2024-02-18 DIAGNOSIS — R0602 Shortness of breath: Secondary | ICD-10-CM | POA: Diagnosis not present

## 2024-02-18 DIAGNOSIS — F17210 Nicotine dependence, cigarettes, uncomplicated: Secondary | ICD-10-CM | POA: Diagnosis not present

## 2024-02-18 DIAGNOSIS — I214 Non-ST elevation (NSTEMI) myocardial infarction: Secondary | ICD-10-CM | POA: Diagnosis not present

## 2024-02-18 DIAGNOSIS — Z720 Tobacco use: Secondary | ICD-10-CM | POA: Diagnosis not present

## 2024-02-18 NOTE — Progress Notes (Signed)
 Plan of Care Note for accepted transfer   Patient: Isaac Wilkerson MRN: [id number]   DOA: (Not on file)  Facility requesting transfer: [facility] Requesting Provider: Dr. Robby Sermon Reason for transfer: Services not available Facility course: Presented with worsening shortness of breath.  Does have history of COPD and CHF (proBNP elevated to 13,000) and they have started diuresis.  However he had out of proportion troponin elevation initially to 10,000 then 11,000, 9000, 2000.  Currently chest pain-free.  They have discussed case with cardiology who agreed to see the patient with [facility] as primary team.  Cardiology will make determination on arrival for additional testing versus catheterization.  Current bed availability is limited so patient may not get a bed soon.  Let cardiology know when patient does arrive.  Plan of care: The patient is accepted for admission to Telemetry unit, at [facility]..   Author: Synetta Fail, MD 02/18/2024  Check [email or web address] for on-call coverage.  Nursing staff, Please call [facility] Admits & Consults System-Wide number on Amion as soon as patient's arrival, so appropriate admitting provider can evaluate the pt.

## 2024-02-19 ENCOUNTER — Other Ambulatory Visit: Payer: Self-pay

## 2024-02-19 ENCOUNTER — Inpatient Hospital Stay (HOSPITAL_COMMUNITY)
Admission: EM | Admit: 2024-02-19 | Discharge: 2024-02-23 | DRG: 280 | Disposition: A | Discharge status: Home / Self Care | Payer: Medicaid Other | Source: Other Acute Inpatient Hospital | Attending: Internal Medicine | Admitting: Internal Medicine

## 2024-02-19 ENCOUNTER — Encounter (HOSPITAL_COMMUNITY): Payer: Self-pay | Admitting: Internal Medicine

## 2024-02-19 DIAGNOSIS — F17210 Nicotine dependence, cigarettes, uncomplicated: Secondary | ICD-10-CM | POA: Diagnosis present

## 2024-02-19 DIAGNOSIS — I10 Essential (primary) hypertension: Secondary | ICD-10-CM | POA: Diagnosis present

## 2024-02-19 DIAGNOSIS — I130 Hypertensive heart and chronic kidney disease with heart failure and stage 1 through stage 4 chronic kidney disease, or unspecified chronic kidney disease: Principal | ICD-10-CM | POA: Diagnosis present

## 2024-02-19 DIAGNOSIS — I5021 Acute systolic (congestive) heart failure: Secondary | ICD-10-CM | POA: Diagnosis not present

## 2024-02-19 DIAGNOSIS — Z823 Family history of stroke: Secondary | ICD-10-CM

## 2024-02-19 DIAGNOSIS — I5181 Takotsubo syndrome: Secondary | ICD-10-CM | POA: Diagnosis present

## 2024-02-19 DIAGNOSIS — N179 Acute kidney failure, unspecified: Secondary | ICD-10-CM | POA: Diagnosis present

## 2024-02-19 DIAGNOSIS — R918 Other nonspecific abnormal finding of lung field: Secondary | ICD-10-CM | POA: Diagnosis not present

## 2024-02-19 DIAGNOSIS — I252 Old myocardial infarction: Secondary | ICD-10-CM

## 2024-02-19 DIAGNOSIS — I509 Heart failure, unspecified: Secondary | ICD-10-CM | POA: Diagnosis not present

## 2024-02-19 DIAGNOSIS — Z7982 Long term (current) use of aspirin: Secondary | ICD-10-CM | POA: Diagnosis not present

## 2024-02-19 DIAGNOSIS — Z7984 Long term (current) use of oral hypoglycemic drugs: Secondary | ICD-10-CM

## 2024-02-19 DIAGNOSIS — I5033 Acute on chronic diastolic (congestive) heart failure: Secondary | ICD-10-CM | POA: Diagnosis not present

## 2024-02-19 DIAGNOSIS — Z91148 Patient's other noncompliance with medication regimen for other reason: Secondary | ICD-10-CM

## 2024-02-19 DIAGNOSIS — I2489 Other forms of acute ischemic heart disease: Secondary | ICD-10-CM | POA: Diagnosis not present

## 2024-02-19 DIAGNOSIS — N1831 Chronic kidney disease, stage 3a: Secondary | ICD-10-CM | POA: Diagnosis present

## 2024-02-19 DIAGNOSIS — F1010 Alcohol abuse, uncomplicated: Secondary | ICD-10-CM | POA: Diagnosis present

## 2024-02-19 DIAGNOSIS — I428 Other cardiomyopathies: Secondary | ICD-10-CM | POA: Diagnosis present

## 2024-02-19 DIAGNOSIS — F1910 Other psychoactive substance abuse, uncomplicated: Secondary | ICD-10-CM | POA: Diagnosis not present

## 2024-02-19 DIAGNOSIS — Z79899 Other long term (current) drug therapy: Secondary | ICD-10-CM | POA: Diagnosis not present

## 2024-02-19 DIAGNOSIS — F1210 Cannabis abuse, uncomplicated: Secondary | ICD-10-CM | POA: Diagnosis present

## 2024-02-19 DIAGNOSIS — I272 Pulmonary hypertension, unspecified: Secondary | ICD-10-CM | POA: Diagnosis present

## 2024-02-19 DIAGNOSIS — Z720 Tobacco use: Secondary | ICD-10-CM | POA: Diagnosis present

## 2024-02-19 DIAGNOSIS — J449 Chronic obstructive pulmonary disease, unspecified: Secondary | ICD-10-CM | POA: Diagnosis present

## 2024-02-19 DIAGNOSIS — F1410 Cocaine abuse, uncomplicated: Secondary | ICD-10-CM | POA: Diagnosis present

## 2024-02-19 DIAGNOSIS — I2510 Atherosclerotic heart disease of native coronary artery without angina pectoris: Secondary | ICD-10-CM | POA: Diagnosis present

## 2024-02-19 DIAGNOSIS — I21A1 Myocardial infarction type 2: Secondary | ICD-10-CM | POA: Insufficient documentation

## 2024-02-19 DIAGNOSIS — Z8249 Family history of ischemic heart disease and other diseases of the circulatory system: Secondary | ICD-10-CM | POA: Diagnosis not present

## 2024-02-19 DIAGNOSIS — I5042 Chronic combined systolic (congestive) and diastolic (congestive) heart failure: Secondary | ICD-10-CM | POA: Diagnosis present

## 2024-02-19 DIAGNOSIS — I5031 Acute diastolic (congestive) heart failure: Secondary | ICD-10-CM | POA: Diagnosis not present

## 2024-02-19 DIAGNOSIS — R0602 Shortness of breath: Secondary | ICD-10-CM | POA: Diagnosis present

## 2024-02-19 DIAGNOSIS — I5043 Acute on chronic combined systolic (congestive) and diastolic (congestive) heart failure: Secondary | ICD-10-CM | POA: Diagnosis present

## 2024-02-19 DIAGNOSIS — Z5329 Procedure and treatment not carried out because of patient's decision for other reasons: Secondary | ICD-10-CM | POA: Diagnosis not present

## 2024-02-19 DIAGNOSIS — I214 Non-ST elevation (NSTEMI) myocardial infarction: Secondary | ICD-10-CM | POA: Diagnosis present

## 2024-02-19 LAB — COMPREHENSIVE METABOLIC PANEL
ALT: 19 U/L (ref 0–44)
AST: 21 U/L (ref 15–41)
Albumin: 2.9 g/dL — ABNORMAL LOW (ref 3.5–5.0)
Alkaline Phosphatase: 50 U/L (ref 38–126)
Anion gap: 8 (ref 5–15)
BUN: 13 mg/dL (ref 6–20)
CO2: 27 mmol/L (ref 22–32)
Calcium: 8.7 mg/dL — ABNORMAL LOW (ref 8.9–10.3)
Chloride: 105 mmol/L (ref 98–111)
Creatinine, Ser: 1.33 mg/dL — ABNORMAL HIGH (ref 0.61–1.24)
GFR, Estimated: >60 mL/min (ref >60)
Glucose, Bld: 108 mg/dL — ABNORMAL HIGH (ref 70–99)
Potassium: 4.1 mmol/L (ref 3.5–5.1)
Sodium: 140 mmol/L (ref 135–145)
Total Bilirubin: 1.6 mg/dL — ABNORMAL HIGH (ref 0.0–1.2)
Total Protein: 6.4 g/dL — ABNORMAL LOW (ref 6.5–8.1)

## 2024-02-19 LAB — PROTIME-INR
INR: 1.1 (ref 0.8–1.2)
Prothrombin Time: 14.4 s (ref 11.4–15.2)

## 2024-02-19 LAB — GLUCOSE, CAPILLARY: Glucose-Capillary: 90 mg/dL (ref 70–99)

## 2024-02-19 LAB — CBC
HCT: 34.9 % — ABNORMAL LOW (ref 39.0–52.0)
Hemoglobin: 11 g/dL — ABNORMAL LOW (ref 13.0–17.0)
MCH: 30.1 pg (ref 26.0–34.0)
MCHC: 31.5 g/dL (ref 30.0–36.0)
MCV: 95.6 fL (ref 80.0–100.0)
Platelets: 249 10*3/uL (ref 150–400)
RBC: 3.65 MIL/uL — ABNORMAL LOW (ref 4.22–5.81)
RDW: 14 % (ref 11.5–15.5)
WBC: 5.4 10*3/uL (ref 4.0–10.5)
nRBC: 0 % (ref 0.0–0.2)

## 2024-02-19 LAB — HIV ANTIBODY (ROUTINE TESTING W REFLEX): HIV Screen 4th Generation wRfx: NONREACTIVE

## 2024-02-19 LAB — HEPARIN LEVEL (UNFRACTIONATED): Heparin Unfractionated: 0.17 [IU]/mL — ABNORMAL LOW (ref 0.30–0.70)

## 2024-02-19 LAB — TROPONIN I (HIGH SENSITIVITY): Troponin I (High Sensitivity): 3420 ng/L (ref <18)

## 2024-02-19 MED ORDER — AMLODIPINE BESYLATE 10 MG PO TABS
10.0000 mg | ORAL_TABLET | Freq: Every day | ORAL | Status: DC
  Administered 2024-02-20 – 2024-02-21 (×2): 10 mg via ORAL
  Filled 2024-02-19 (×2): qty 1

## 2024-02-19 MED ORDER — ACETAMINOPHEN 650 MG RE SUPP: 650.0000 mg | Freq: Four times a day (QID) | RECTAL | Status: DC | PRN

## 2024-02-19 MED ORDER — ONDANSETRON HCL 4 MG PO TABS: 4.0000 mg | ORAL_TABLET | Freq: Four times a day (QID) | ORAL | Status: DC | PRN

## 2024-02-19 MED ORDER — ISOSORBIDE MONONITRATE ER 60 MG PO TB24
60.0000 mg | ORAL_TABLET | Freq: Every day | ORAL | Status: DC
  Administered 2024-02-20 – 2024-02-21 (×2): 60 mg via ORAL
  Filled 2024-02-19 (×2): qty 1

## 2024-02-19 MED ORDER — THIAMINE MONONITRATE 100 MG PO TABS
100.0000 mg | ORAL_TABLET | Freq: Every day | ORAL | Status: DC
  Administered 2024-02-20 – 2024-02-23 (×4): 100 mg via ORAL
  Filled 2024-02-19 (×4): qty 1

## 2024-02-19 MED ORDER — HEPARIN (PORCINE) 25000 UT/250ML-% IV SOLN
1900.0000 [IU]/h | INTRAVENOUS | Status: DC
  Administered 2024-02-20 – 2024-02-21 (×4): 1900 [IU]/h via INTRAVENOUS
  Filled 2024-02-19 (×4): qty 250

## 2024-02-19 MED ORDER — ASPIRIN 81 MG PO TBEC
81.0000 mg | DELAYED_RELEASE_TABLET | Freq: Every day | ORAL | Status: DC
  Administered 2024-02-20 – 2024-02-23 (×4): 81 mg via ORAL
  Filled 2024-02-19 (×5): qty 1

## 2024-02-19 MED ORDER — ACETAMINOPHEN 325 MG PO TABS
650.0000 mg | ORAL_TABLET | Freq: Four times a day (QID) | ORAL | Status: DC | PRN
  Administered 2024-02-20 – 2024-02-23 (×4): 650 mg via ORAL
  Filled 2024-02-19 (×4): qty 2

## 2024-02-19 MED ORDER — FUROSEMIDE 10 MG/ML IJ SOLN
40.0000 mg | Freq: Once | INTRAMUSCULAR | Status: AC
  Administered 2024-02-19: 40 mg via INTRAVENOUS
  Filled 2024-02-19: qty 4

## 2024-02-19 MED ORDER — PRAVASTATIN SODIUM 40 MG PO TABS
40.0000 mg | ORAL_TABLET | Freq: Every evening | ORAL | Status: DC
  Administered 2024-02-20 – 2024-02-21 (×2): 40 mg via ORAL
  Filled 2024-02-19 (×2): qty 1

## 2024-02-19 MED ORDER — ONDANSETRON HCL 4 MG/2ML IJ SOLN: 4.0000 mg | Freq: Four times a day (QID) | INTRAMUSCULAR | Status: DC | PRN

## 2024-02-19 MED ORDER — ENSURE ENLIVE PO LIQD
237.0000 mL | Freq: Two times a day (BID) | ORAL | Status: DC
  Administered 2024-02-20 – 2024-02-23 (×7): 237 mL via ORAL

## 2024-02-19 NOTE — Progress Notes (Signed)
 PHARMACY - ANTICOAGULATION CONSULT NOTE  Pharmacy Consult for heparin gtt Indication: chest pain/ACS  No Known Allergies  Patient Measurements: Height: 5\' 6"  (167.6 cm) Weight: 77.2 kg (170 lb 3.1 oz) IBW/kg (Calculated) : 63.8 Heparin Dosing Weight: 77.2 kg  Vital Signs: Temp: 97.7 F (36.5 C) (02/28 1617) Temp Source: Oral (02/28 1617) BP: 156/119 (02/28 1617) Pulse Rate: 99 (02/28 1617)  Labs: Recent Labs    02/19/24 1732  HGB 11.0*  HCT 34.9*  PLT 249  LABPROT 14.4  INR 1.1  HEPARINUNFRC 0.17*  CREATININE 1.33*  TROPONINIHS 3,420*    Estimated Creatinine Clearance: 59.3 mL/min (A) (by C-G formula based on SCr of 1.33 mg/dL (H)).   Medical History: Past Medical History:  Diagnosis Date   Alcohol abuse    CAD (coronary artery disease)    Chronic combined systolic and diastolic CHF (congestive heart failure) (HCC)    Cocaine use    Hypertension     Assessment: 58 yo M with concern for NSTEMI. Pharmacy consulted for heparin infusion for anticoagulation. Pt is transfer from OSH. Per RN, patient with heparin running upon transfer to [facility].  Heparin currently running at 1450 units/hr from OSH HL 0.17 - subtherapeutic at current rate  Goal of Therapy:  Heparin level 0.3-0.7 units/ml Monitor platelets by anticoagulation protocol: Yes   Plan:  Increase heparin to 1700 units/hr  6hr HL   Daily HL, CBC F/u longterm anticoag plans and cardiology plans  Calton Dach, PharmD, BCCCP Clinical Pharmacist 02/19/2024 6:44 PM

## 2024-02-19 NOTE — Progress Notes (Signed)
 PHARMACY - ANTICOAGULATION CONSULT NOTE  Pharmacy Consult for heparin gtt Indication: chest pain/ACS  No Known Allergies  Patient Measurements: Height: 5\' 6"  (167.6 cm) Weight: 77.2 kg (170 lb 3.1 oz) IBW/kg (Calculated) : 63.8 Heparin Dosing Weight: 77.2 kg  Vital Signs: Temp: 97.7 F (36.5 C) (02/28 1617) Temp Source: Oral (02/28 1617) BP: 156/119 (02/28 1617) Pulse Rate: 99 (02/28 1617)  Labs: No results for input(s): "HGB", "HCT", "PLT", "APTT", "LABPROT", "INR", "HEPARINUNFRC", "HEPRLOWMOCWT", "CREATININE", "CKTOTAL", "CKMB", "TROPONINIHS" in the last 72 hours.  CrCl cannot be calculated (Patient's most recent lab result is older than the maximum 21 days allowed.).   Medical History: Past Medical History:  Diagnosis Date   Alcohol abuse    CAD (coronary artery disease)    Chronic combined systolic and diastolic CHF (congestive heart failure) (HCC)    Cocaine use    Hypertension     Assessment: 58 yo M with concern for NSTEMI. Pharmacy consulted for heparin infusion for anticoagulation. Pt is transfer from OSH. Per RN, patient with heparin running upon transfer to [facility].  Heparin currently running at 1450 units/hr from OSH Last HL from OSH 0.27 (subtherapeutic) unclear what rate this correlates to however.  Goal of Therapy:  Heparin level 0.3-0.7 units/ml Monitor platelets by anticoagulation protocol: Yes   Plan:  STAT HL now to guide dosing at [facility] Daily HL, CBC F/u longterm anticoag plans and cardiology plans  Calton Dach, PharmD, BCCCP Clinical Pharmacist 02/19/2024 5:47 PM

## 2024-02-19 NOTE — Consult Note (Addendum)
 Cardiology Consultation   Patient ID: Isaac Wilkerson MRN: [id number]; DOB: 1966-11-27  Admit date: 02/19/2024 Date of Consult: 02/19/2024  PCP:  Toma Deiters, MD   [facility] Providers Cardiologist:  Dina Rich, MD       Patient Profile:   Isaac Wilkerson is a 58 y.o. male with a hx of hypertension, chronic combined CHF, history of NSTEMI 09/2018, tobacco use, polysubstance abuse who is being seen 02/19/2024 for the evaluation of NSTEMI, acute on chronic CHF at the request of Dr. Jomarie Longs.  History of Present Illness:   Isaac Wilkerson has a past medical history of hx of hypertension, chronic combined CHF, history of NSTEMI 09/2018, tobacco use, polysubstance abuse. He presented to [facility] on 02/18/2024 complaining of shortness of breath, productive cough, worse with exertion. Patient denied any chest pain at presentation to the ED. Denied any known history of asthma or COPD but did state that he used to wear oxygen at home, but apparently stopped wearing it around 1.5 years ago. He admited to tobacco use with 20 pack year smoking history, drinking around 3 drinks/week and use of cocaine and marijuana.   Relevant workup (reviewed via Care Everywhere) at [facility] includes: EKG showed NSR with LVH, possible T wave abnormality, prolonged QT (unable to view EKG), CXR showed cardiomegaly with diffuse bilateral interstitial opacities suggestive of CHF, troponin was elevated at 10,777 > 11,329 > 9,186 > 8,104 > 5,846. Pro-BNP was 13,000. Creatinine was 1.34. Respiratory panel was negative for COVID, Flu A/B, RSV, hemoglobin 11.3, HCT 34.7,   Patient was started on IV heparin and IV Lasix 40 mg x 1 dose, he was also given a DuoNeb which improved his symptoms. [facility] reached out to [facility] who was accepted to [facility] service with cardiology to consult.   Per review of notes from [facility] it seemed that patient briefly left AMA around 10 AM on 2/28 to bail his  girlfriend out of jail. At that time they removed his IV, stopped medications and had him sign out AMA. The he returned to the ED about 30 minutes later, he was unable to produce the money for bail, to continue care.   Patient was seen at [facility] in October 2019 for chest pain that ended up being diagnosed as an NSTEMI. He underwent a coronary CTA which showed elevated calcium score with moderate stenosis in proximal RCA but ultimately a catheterization was deferred due to compliance issues with patient. Echo done this admission showed: EF 55-60%, mild LVH, apical hypokinesis, grade I diastolic dysfunction. Due to his history of cocaine abuse and a positive UDS for cocaine at that time he was unable to be started on a beta-blocker. He also had relatively uncontrolled HTN while on max doses of amlodipine and lisinopril, thought to be secondary to cocaine use and non-compliance.   He arrived to [facility] later 2/28 and was admitted to [facility] service with cardiology to consult.   After speaking with patient, he agrees with the history as stated above.  He admits that he does not know what medications he is typically taking in a day, sometimes he takes up to 8, sometimes he does not take any at all.  He does state that recently he had stopped taking some of his medications, started eating more processed/high salt containing foods and using more of his substances.  He states that he knows that he is probably in here due to noncompliance.  He currently denied any chest pain, also states  that he did not experience chest pain at all during this episode.  Past Medical History:  Diagnosis Date   Alcohol abuse    CAD (coronary artery disease)    Chronic combined systolic and diastolic CHF (congestive heart failure) (HCC)    Cocaine use    Hypertension    Past Surgical History:  Procedure Laterality Date   EAR CYST EXCISION      Home Medications:  Prior to Admission medications   Medication Sig Start Date  End Date Taking? Authorizing Provider  amLODipine (NORVASC) 10 MG tablet Take 1 tablet (10 mg total) by mouth daily. 08/23/20   Antoine Poche, MD  aspirin EC 81 MG tablet Take 81 mg by mouth daily.    [provider]  furosemide (LASIX) 40 MG tablet Take 1 tablet (40 mg total) by mouth daily. 11/02/19   Antoine Poche, MD  isosorbide mononitrate (IMDUR) 60 MG 24 hr tablet Take 1 tablet (60 mg total) by mouth daily. 11/02/19 05/29/23  Antoine Poche, MD  lisinopril (ZESTRIL) 40 MG tablet TAKE 1 TABLET BY MOUTH DAILY 04/29/21   Antoine Poche, MD  potassium chloride SA (KLOR-CON) 20 MEQ tablet Take 1 tablet (20 mEq total) by mouth daily. 11/02/19   Antoine Poche, MD  pravastatin (PRAVACHOL) 40 MG tablet Take 1 tablet (40 mg total) by mouth every evening. 02/24/20   Antoine Poche, MD   Inpatient Medications: Scheduled Meds:  amLODipine  10 mg Oral Daily   aspirin EC  81 mg Oral Daily   [START ON 02/20/2024] feeding supplement  237 mL Oral BID BM   isosorbide mononitrate  60 mg Oral Daily   pravastatin  40 mg Oral QPM   thiamine  100 mg Oral Daily   Continuous Infusions:  heparin     PRN Meds: acetaminophen **OR** acetaminophen, ondansetron **OR** ondansetron (ZOFRAN) IV  Allergies:   No Known Allergies  Social History:   Social History   Socioeconomic History   Marital status: Unknown    Spouse name: Not on file   Number of children: Not on file   Years of education: Not on file   Highest education level: Not on file  Occupational History   Occupation: cook  Tobacco Use   Smoking status: Every Day    Current packs/day: 0.00    Average packs/day: 0.5 packs/day for 40.0 years (20.0 ttl pk-yrs)    Types: Cigarettes    Start date: 10/12/1976    Last attempt to quit: 10/12/2016    Years since quitting: 7.3   Smokeless tobacco: Never   Tobacco comments:    Only smokes when he drinks  Vaping Use   Vaping status: Never Used  Substance and Sexual  Activity   Alcohol use: Yes    Alcohol/week: 3.0 standard drinks of alcohol    Types: 3 Cans of beer per week    Comment: Previously listed as heavy, currently 3 beers 3x/week   Drug use: Yes    Types: Cocaine, Marijuana    Comment: every so often   Sexual activity: Not on file  Other Topics Concern   Not on file  Social History Narrative   Not on file   Social Drivers of Health   Financial Resource Strain: Low Risk  (12/18/2023)   Received from [facility]   Overall Financial Resource Strain (CARDIA)    Difficulty of Paying Living Expenses: Not very hard  Food Insecurity: No Food Insecurity (02/19/2024)  Hunger Vital Sign    Worried About Running Out of Food in the Last Year: Never true    Ran Out of Food in the Last Year: Never true  Transportation Needs: No Transportation Needs (02/19/2024)   PRAPARE - Administrator, Civil Service (Medical): No    Lack of Transportation (Non-Medical): No  Physical Activity: Not on file  Stress: Not on file  Social Connections: Not on file  Intimate Partner Violence: Not At Risk (02/19/2024)   Humiliation, Afraid, Rape, and Kick questionnaire    Fear of Current or Ex-Partner: No    Emotionally Abused: No    Physically Abused: No    Sexually Abused: No    Family History:   Family History  Problem Relation Age of Onset   Congestive Heart Failure Father    CAD Father 57   Cardiomyopathy Brother    CAD Brother 60   CAD Mother 70    ROS:  Please see the history of present illness.  All other ROS reviewed and negative.     Physical Exam/Data:   Vitals:   02/19/24 1617 02/19/24 1654  BP: (!) 156/119   Pulse: 99   Resp: 18   Temp: 97.7 F (36.5 C)   TempSrc: Oral   SpO2: 99%   Weight:  77.2 kg  Height:  5\' 6"  (1.676 m)   No intake or output data in the 24 hours ending 02/19/24 1827    02/19/2024    4:54 PM 05/30/2020   10:13 AM 11/02/2019   12:29 PM  Last 3 Weights  Weight (lbs) 170 lb 3.1 oz 216 lb 9.6 oz  205 lb  Weight (kg) 77.2 kg 98.249 kg 92.987 kg     Body mass index is 27.47 kg/m.  General:  Well nourished, well developed, in no acute distress, on room air  HEENT: normal Neck: mild JVD Vascular: No carotid bruits; Distal pulses 2+ bilaterally Cardiac:  normal S1, S2; RRR; no murmur  Lungs:  poor air movement, decreased lung sounds at bases   Abd: soft, non-tender Ext: no edema Musculoskeletal:  No deformities Skin: warm and dry  Neuro:  no focal abnormalities noted Psych:  Normal affect   Telemetry:  Telemetry was personally reviewed and demonstrates:  sinus rhythm, HR 90-100s  Relevant CV Studies: Echocardiogram, ordered pending results   Laboratory Data:  High Sensitivity Troponin:  No results for input(s): "TROPONINIHS" in the last 720 hours.   ChemistryNo results for input(s): "NA", "K", "CL", "CO2", "GLUCOSE", "BUN", "CREATININE", "CALCIUM", "MG", "GFRNONAA", "GFRAA", "ANIONGAP" in the last 168 hours.  No results for input(s): "PROT", "ALBUMIN", "AST", "ALT", "ALKPHOS", "BILITOT" in the last 168 hours. Lipids No results for input(s): "CHOL", "TRIG", "HDL", "LABVLDL", "LDLCALC", "CHOLHDL" in the last 168 hours.  Hematology Recent Labs  Lab 02/19/24 1732  WBC 5.4  RBC 3.65*  HGB 11.0*  HCT 34.9*  MCV 95.6  MCH 30.1  MCHC 31.5  RDW 14.0  PLT 249   Thyroid No results for input(s): "TSH", "FREET4" in the last 168 hours.  BNPNo results for input(s): "BNP", "PROBNP" in the last 168 hours.  DDimer No results for input(s): "DDIMER" in the last 168 hours.  Radiology/Studies:  No results found.  Assessment and Plan:   NSTEMI CAD Elevated troponin at [facility]: 10,777 > 11,329 > 9,186 > 8,104 > 5,846 Awaiting updated troponin at our facility  Coronary CTA (2019) showed moderate RCA stenosis, deemed not significant disease by [facility] Patient reports medication non-compliance  and frequent cocaine, marijuana and alcohol use  Continue IV heparin  Continue ASA 81  mg daily Continue pravastatin 40 mg daily Continue Imdur 60 mg daily   Acute on chronic combined CHF Echo from 03/2016 showed EF 25-30%, EF then recovered to 55-60% in 09/2018 Echo from 09/2018 also showed: grade 1 diastolic dysfunction, apical hypokinesis, mild LVH He reports medication noncompliance because he states he is going through hard times in his personal life Mild JVD and decreased sounds at lung bases on exam, no LE edema Patient states he was experiencing DOE and orthopnea over the last 3 days progressing worse each day -- admits to noncompliance with medications and increase in salt/processed foods/substances Patient was giving a single dose of IV Lasix 40 mg at [facility] on 02/18/2024 Patient stated he had significant urine output with the IV Lasix  Reports significant improvement in his symptoms today Currently on room air with SpO2 at 98-100% Continue IV Lasix  Strict I/Os, daily weights, daily BMPs Repeat echo ordered, pending results Awaiting labs from today Slowly initiate and titrate GDMT pending results of echo and labs  AVOID beta-blockers due to frequent cocaine abuse  Would be reasonable for patient to undergo cardiac catheterization, will schedule for Monday  Hypertension  Most recent BP 159/119 Patient reports pretty significant noncompliance with his medications Patient also reports frequent polysubstance abuse including alcohol, tobacco, marijuana, cocaine Continue amlodipine 10 mg daily  Continue Imdur 60 mg daily  Holding lisinopril 2/2 AKI  AVOID beta-blockers due to frequent cocaine abuse   Per primary Tobacco abuse Polysubstance abuse Alcohol abuse, possibly requiring CIWA AKI Suspected COPD  Risk Assessment/Risk Scores:     TIMI Risk Score for Unstable Angina or Non-ST Elevation MI:   The patient's TIMI risk score is 2, which indicates a 8% risk of all cause mortality, new or recurrent myocardial infarction or need for urgent  revascularization in the next 14 days.  New York Heart Association (NYHA) Functional Class NYHA Class II    For questions or updates, please contact [facility] Please consult [email or web address] for contact info under    Signed, Olena Leatherwood, PA-C  02/19/2024 6:27 PM   History and all data above reviewed.  Patient examined.  I agree with the findings as above.  Patient presents with shortness of breath.  He has a history of nonobstructive coronary disease.  He said he is walking about 3 miles a day most of the time and had been doing well until 2 days ago.  He started drinking heavily on Tuesday or Wednesday of this week.  He stopped taking his medications.  He started smoking a lot of cigarettes.  He then developed acute shortness of breath on the day of presentation to [facility].  He was describing chest discomfort he said he could not lie flat and had to try to sleep sitting up.  He could not sleep.  Findings and ER are as noted above.  I was able to see the EKG from [facility] and there is T wave inversion in the lateral leads but no ST elevation.  There was T wave inversion in the previous EKG in 2019.  The patient exam reveals COR: RRR  ,  Lungs: Decreased breath  sounds   ,  Abd: Positive bowel sounds, no rebound no guarding, Ext No edema   .  All available labs, radiology testing, previous records reviewed. Agree with documented assessment and plan.  Acute shortness of breath: This obviously represents  some acute heart failure.  He will need an echocardiogram to judge his ejection fraction and treatment with IV Lasix today.  He is responded well to IV Lasix at [facility].  He has had previously reduced ejection fraction that at the last evaluation had normalized and wants to be surprised if this is reduced.  Elevated troponin: He is not having any chest discomfort.  EKG is pending.  Continue heparin.  Cardiac cath to be indicated electively.  The patient understands that risks  included but are not limited to stroke (1 in 1000), death (1 in 1000), kidney failure [usually temporary] (1 in 500), bleeding (1 in 200), allergic reaction [possibly serious] (1 in 200).  The patient understands and agrees to proceed.   Rollene Rotunda  6:28 PM  02/19/2024

## 2024-02-19 NOTE — H&P (Signed)
 History and Physical    Kashmere Staffa MWN:[id number] DOB: 1966/09/28 DOA: 02/19/2024  Referring MD/NP/PA: EDP at [facility] PCP:  Patient coming from: Same  Chief Complaint: Shortness of breath  HPI: Isaac Wilkerson is a 57/M with chronic combined CHF with recovered EF, polysubstance abuse including tobacco EtOH and cocaine, nonobstructive CAD presented to ED at [facility] yesterday morning with worsening shortness of breath.  Patient reports some progressive dyspnea over 2 to 3 days, acutely worsened yesterday, associated with some orthopnea.  He denied any chest pain. -ED Course: Hypertensive, EKG with LVH, T wave inversion in lateral leads, initial troponin was 10K, peaked at 11K and then started trending down, chest x-ray suggestive of CHF.  EDP discussed with [facility] and [facility], patient was subsequently transferred to [facility] for further evaluation.  He reports improvement in breathing, urinated a lot after Lasix yesterday, denies chest pain at this time  Review of Systems: As per HPI otherwise 14 point review of systems negative.   Past Medical History:  Diagnosis Date   Alcohol abuse    CAD (coronary artery disease)    Chronic combined systolic and diastolic CHF (congestive heart failure) (HCC)    Cocaine use    Hypertension     Past Surgical History:  Procedure Laterality Date   EAR CYST EXCISION       reports that he has been smoking. He started smoking about 47 years ago. He has a 20 pack-year smoking history. He has never used smokeless tobacco. He reports current alcohol use of about 3.0 standard drinks of alcohol per week. He reports current drug use. Drugs: Cocaine and Marijuana.  No Known Allergies  Family History  Problem Relation Age of Onset   Congestive Heart Failure Father    CAD Father 53   Cardiomyopathy Brother    CAD Brother 53   CAD Mother 28     Prior to Admission medications   Medication Sig Start Date End Date Taking? Authorizing  Provider  amLODipine (NORVASC) 10 MG tablet Take 1 tablet (10 mg total) by mouth daily. 08/23/20   Antoine Poche, MD  aspirin EC 81 MG tablet Take 81 mg by mouth daily.    [provider]  furosemide (LASIX) 40 MG tablet Take 1 tablet (40 mg total) by mouth daily. 11/02/19   Antoine Poche, MD  isosorbide mononitrate (IMDUR) 60 MG 24 hr tablet Take 1 tablet (60 mg total) by mouth daily. 11/02/19 05/29/23  Antoine Poche, MD  lisinopril (ZESTRIL) 40 MG tablet TAKE 1 TABLET BY MOUTH DAILY 04/29/21   Antoine Poche, MD  potassium chloride SA (KLOR-CON) 20 MEQ tablet Take 1 tablet (20 mEq total) by mouth daily. 11/02/19   Antoine Poche, MD  pravastatin (PRAVACHOL) 40 MG tablet Take 1 tablet (40 mg total) by mouth every evening. 02/24/20   Antoine Poche, MD    Physical Exam: Vitals:   02/19/24 1617 02/19/24 1654  BP: (!) 156/119   Pulse: 99   Resp: 18   Temp: 97.7 F (36.5 C)   TempSrc: Oral   SpO2: 99%   Weight:  77.2 kg  Height:  5\' 6"  (1.676 m)      Constitutional: NAD, calm, comfortable HEENT:+ JVD Respiratory: Decreased breath sounds at the bases Cardiovascular: S1S2/RRR Abdomen: soft, non tender, Bowel sounds positive.  Musculoskeletal: No joint deformity upper and lower extremities. Ext: no edema Skin: no rashes Neurologic: CN 2-12 grossly intact. Sensation intact, DTR normal. Strength  5/5 in all 4.  Psychiatric: Normal judgment and insight. Alert and oriented x 3. Normal mood.   Labs on Admission: I have personally reviewed following labs and imaging studies  CBC: No results for input(s): "WBC", "NEUTROABS", "HGB", "HCT", "MCV", "PLT" in the last 168 hours. Basic Metabolic Panel: No results for input(s): "NA", "K", "CL", "CO2", "GLUCOSE", "BUN", "CREATININE", "CALCIUM", "MG", "PHOS" in the last 168 hours. GFR: CrCl cannot be calculated (Patient's most recent lab result is older than the maximum 21 days allowed.). Liver Function Tests: No  results for input(s): "AST", "ALT", "ALKPHOS", "BILITOT", "PROT", "ALBUMIN" in the last 168 hours. No results for input(s): "LIPASE", "AMYLASE" in the last 168 hours. No results for input(s): "AMMONIA" in the last 168 hours. Coagulation Profile: No results for input(s): "INR", "PROTIME" in the last 168 hours. Cardiac Enzymes: No results for input(s): "CKTOTAL", "CKMB", "CKMBINDEX", "TROPONINI" in the last 168 hours. BNP (last 3 results) No results for input(s): "PROBNP" in the last 8760 hours. HbA1C: No results for input(s): "HGBA1C" in the last 72 hours. CBG: Recent Labs  Lab 02/19/24 1624  GLUCAP 90   Lipid Profile: No results for input(s): "CHOL", "HDL", "LDLCALC", "TRIG", "CHOLHDL", "LDLDIRECT" in the last 72 hours. Thyroid Function Tests: No results for input(s): "TSH", "T4TOTAL", "FREET4", "T3FREE", "THYROIDAB" in the last 72 hours. Anemia Panel: No results for input(s): "VITAMINB12", "FOLATE", "FERRITIN", "TIBC", "IRON", "RETICCTPCT" in the last 72 hours. Urine analysis: No results found for: "COLORURINE", "APPEARANCEUR", "LABSPEC", "PHURINE", "GLUCOSEU", "HGBUR", "BILIRUBINUR", "KETONESUR", "PROTEINUR", "UROBILINOGEN", "NITRITE", "LEUKOCYTESUR" Sepsis Labs: @LABRCNTIP (procalcitonin:4,lacticidven:4) )No results found for this or any previous visit (from the past 240 hours).   Radiological Exams on Admission: No results found.  EKG: Independently reviewed. NSR, LVH with T wave inversion  Assessment/Plan Principal Problem:    NSTEMI (non-ST elevated myocardial infarction) (HCC) -HS troponin peaked at 11K at [facility] R, will repeat -EKG notes LVH with T wave inversion in lateral readings, likely repolarization abnormalities -Continue IV heparin, add aspirin, continue statin -Cardiology consult -Coronary CTA 2019 with moderate RCA stenosis, not felt to be significant by FFR -Cocaine induced vasospasm could be contributing, last used 4 days ago  Acute on chronic combined  CHF -EF was down to 25-30% in 4/27, subsequently recovered to 65-70% in late 2017 and 2019 -Appears mildly volume overloaded -Continue IV Lasix today -Follow-up repeat echo -Await today's labs, slowly add GDMT  Essential hypertension -Continue amlodipine, hold lisinopril today -Diuretics as noted above  Tobacco abuse   Polysubstance abuse (HCC)   Alcohol abuse -Counseled  Mild AKI,  -Creatinine 1.37 at [facility] ED yesterday, slightly higher than baseline -Monitor  Suspected COPD -Poor air movement, no wheezing at this time, monitor   DVT prophylaxis: IV heparin Code Status: Full code Family Communication: None at bedside, called and updated sister Disposition Plan: Pending above workup Consults called: Cards Admission status: Inpatient  Zannie Cove MD [facility]   02/19/2024, 5:23 PM

## 2024-02-19 NOTE — Plan of Care (Signed)

## 2024-02-20 ENCOUNTER — Inpatient Hospital Stay (HOSPITAL_COMMUNITY)

## 2024-02-20 DIAGNOSIS — I5021 Acute systolic (congestive) heart failure: Secondary | ICD-10-CM

## 2024-02-20 DIAGNOSIS — I5043 Acute on chronic combined systolic (congestive) and diastolic (congestive) heart failure: Secondary | ICD-10-CM | POA: Diagnosis not present

## 2024-02-20 DIAGNOSIS — I5031 Acute diastolic (congestive) heart failure: Secondary | ICD-10-CM

## 2024-02-20 DIAGNOSIS — F1910 Other psychoactive substance abuse, uncomplicated: Secondary | ICD-10-CM | POA: Diagnosis not present

## 2024-02-20 DIAGNOSIS — I2489 Other forms of acute ischemic heart disease: Secondary | ICD-10-CM | POA: Diagnosis not present

## 2024-02-20 LAB — ECHOCARDIOGRAM COMPLETE
AR max vel: 3.5 cm2
AV Peak grad: 5.3 mmHg
Ao pk vel: 1.15 m/s
Area-P 1/2: 6.65 cm2
Calc EF: 31.1 %
Height: 66 in
MV M vel: 4.84 m/s
MV Peak grad: 93.5 mmHg
S' Lateral: 5 cm
Single Plane A2C EF: 24.5 %
Single Plane A4C EF: 35.4 %
Weight: 2687.85 [oz_av]

## 2024-02-20 LAB — CBC
HCT: 34.1 % — ABNORMAL LOW (ref 39.0–52.0)
Hemoglobin: 10.9 g/dL — ABNORMAL LOW (ref 13.0–17.0)
MCH: 30.4 pg (ref 26.0–34.0)
MCHC: 32 g/dL (ref 30.0–36.0)
MCV: 95.3 fL (ref 80.0–100.0)
Platelets: 266 10*3/uL (ref 150–400)
RBC: 3.58 MIL/uL — ABNORMAL LOW (ref 4.22–5.81)
RDW: 14.2 % (ref 11.5–15.5)
WBC: 6.2 10*3/uL (ref 4.0–10.5)
nRBC: 0 % (ref 0.0–0.2)

## 2024-02-20 LAB — HEPARIN LEVEL (UNFRACTIONATED)
Heparin Unfractionated: 0.25 [IU]/mL — ABNORMAL LOW (ref 0.30–0.70)
Heparin Unfractionated: 0.48 [IU]/mL (ref 0.30–0.70)
Heparin Unfractionated: 0.55 [IU]/mL (ref 0.30–0.70)

## 2024-02-20 LAB — BASIC METABOLIC PANEL
Anion gap: 10 (ref 5–15)
BUN: 12 mg/dL (ref 6–20)
CO2: 27 mmol/L (ref 22–32)
Calcium: 8.5 mg/dL — ABNORMAL LOW (ref 8.9–10.3)
Chloride: 102 mmol/L (ref 98–111)
Creatinine, Ser: 1.5 mg/dL — ABNORMAL HIGH (ref 0.61–1.24)
GFR, Estimated: 54 mL/min — ABNORMAL LOW (ref >60)
Glucose, Bld: 100 mg/dL — ABNORMAL HIGH (ref 70–99)
Potassium: 3.3 mmol/L — ABNORMAL LOW (ref 3.5–5.1)
Sodium: 139 mmol/L (ref 135–145)

## 2024-02-20 MED ORDER — PERFLUTREN LIPID MICROSPHERE
1.0000 mL | INTRAVENOUS | Status: AC | PRN
  Administered 2024-02-20: 10 mL via INTRAVENOUS

## 2024-02-20 MED ORDER — POTASSIUM CHLORIDE CRYS ER 20 MEQ PO TBCR
40.0000 meq | EXTENDED_RELEASE_TABLET | Freq: Two times a day (BID) | ORAL | Status: AC
Start: 2024-02-20 — End: 2024-02-20
  Administered 2024-02-20 (×2): 40 meq via ORAL
  Filled 2024-02-20 (×2): qty 2

## 2024-02-20 MED ORDER — FUROSEMIDE 10 MG/ML IJ SOLN
40.0000 mg | Freq: Two times a day (BID) | INTRAMUSCULAR | Status: DC
  Administered 2024-02-20 – 2024-02-21 (×2): 40 mg via INTRAVENOUS
  Filled 2024-02-20 (×2): qty 4

## 2024-02-20 NOTE — Progress Notes (Signed)
  Echocardiogram 2D Echocardiogram has been performed.  Isaac Wilkerson 02/20/2024, 9:03 AM

## 2024-02-20 NOTE — Progress Notes (Signed)
 DAILY PROGRESS NOTE   Patient Name: Isaac Wilkerson Date of Encounter: 02/20/2024 Cardiologist: Dina Rich, MD  Chief Complaint   Breathing has improved  Patient Profile   Isaac Wilkerson is a 58 y.o. male with a hx of hypertension, chronic combined CHF, history of NSTEMI 09/2018, tobacco use, polysubstance abuse who is being seen 02/19/2024 for the evaluation of NSTEMI, acute on chronic CHF at the request of Dr. Jomarie Longs.   Subjective   No chest pain overnight. Breathing has improved. Diuresed about 1L negative. Significant troponin elevation - plan for cardiac cath on Monday. Echo personally reviewed today demonstrates LVEF 30-35% with global hypokinesis, grade 3 DD.  Objective   Vitals:   02/19/24 1900 02/19/24 2019 02/19/24 2347 02/20/24 0420  BP: (!) 155/110 (!) 162/99 (!) 155/88 (!) 141/94  Pulse: 96  (!) 109 92  Resp: 17 19 19 20   Temp: 97.7 F (36.5 C) 98.1 F (36.7 C) 98 F (36.7 C) (!) 97.5 F (36.4 C)  TempSrc: Oral Oral Oral Oral  SpO2: 98% 97% 98% 93%  Weight:    76.2 kg  Height:        Intake/Output Summary (Last 24 hours) at 02/20/2024 1001 Last data filed at 02/20/2024 1610 Gross per 24 hour  Intake 909.71 ml  Output 2000 ml  Net -1090.29 ml   Filed Weights   02/19/24 1654 02/20/24 0420  Weight: 77.2 kg 76.2 kg    Physical Exam   General appearance: alert and no distress Neck: JVD - several cm above sternal notch and thyroid not enlarged, symmetric, no tenderness/mass/nodules Lungs: diminished breath sounds bibasilar Heart: regular rate and rhythm Abdomen: soft, non-tender; bowel sounds normal; no masses,  no organomegaly Extremities: extremities normal, atraumatic, no cyanosis or edema Pulses: 2+ and symmetric Skin: Skin color, texture, turgor normal. No rashes or lesions Neurologic: Grossly normal Psych: Pleasant  Inpatient Medications    Scheduled Meds:  amLODipine  10 mg Oral Daily   aspirin EC  81 mg Oral Daily   feeding  supplement  237 mL Oral BID BM   furosemide  40 mg Intravenous BID   isosorbide mononitrate  60 mg Oral Daily   potassium chloride  40 mEq Oral BID   pravastatin  40 mg Oral QPM   thiamine  100 mg Oral Daily    Continuous Infusions:  heparin 1,900 Units/hr (02/20/24 0144)    PRN Meds: acetaminophen **OR** acetaminophen, ondansetron **OR** ondansetron (ZOFRAN) IV, perflutren lipid microspheres (DEFINITY) IV suspension   Labs   Results for orders placed or performed during the hospital encounter of 02/19/24 (from the past 48 hours)  Glucose, capillary     Status: None   Collection Time: 02/19/24  4:24 PM  Result Value Ref Range   Glucose-Capillary 90 70 - 99 mg/dL    Comment: Glucose reference range applies only to samples taken after fasting for at least 8 hours.  CBC     Status: Abnormal   Collection Time: 02/19/24  5:32 PM  Result Value Ref Range   WBC 5.4 4.0 - 10.5 K/uL   RBC 3.65 (L) 4.22 - 5.81 MIL/uL   Hemoglobin 11.0 (L) 13.0 - 17.0 g/dL   HCT 96.0 (L) 45.4 - 09.8 %   MCV 95.6 80.0 - 100.0 fL   MCH 30.1 26.0 - 34.0 pg   MCHC 31.5 30.0 - 36.0 g/dL   RDW 11.9 14.7 - 82.9 %   Platelets 249 150 - 400 K/uL   nRBC 0.0 0.0 -  0.2 %    Comment: Performed at [facility], [address], [place], [place] [address]  Comprehensive metabolic panel     Status: Abnormal   Collection Time: 02/19/24  5:32 PM  Result Value Ref Range   Sodium 140 135 - 145 mmol/L   Potassium 4.1 3.5 - 5.1 mmol/L   Chloride 105 98 - 111 mmol/L   CO2 27 22 - 32 mmol/L   Glucose, Bld 108 (H) 70 - 99 mg/dL    Comment: Glucose reference range applies only to samples taken after fasting for at least 8 hours.   BUN 13 6 - 20 mg/dL   Creatinine, Ser 6.04 (H) 0.61 - 1.24 mg/dL   Calcium 8.7 (L) 8.9 - 10.3 mg/dL   Total Protein 6.4 (L) 6.5 - 8.1 g/dL   Albumin 2.9 (L) 3.5 - 5.0 g/dL   AST 21 15 - 41 U/L   ALT 19 0 - 44 U/L   Alkaline Phosphatase 50 38 - 126 U/L   Total Bilirubin 1.6 (H) 0.0 -  1.2 mg/dL   GFR, Estimated >54 >09 mL/min    Comment: (NOTE) Calculated using the CKD-EPI Creatinine Equation (2021)    Anion gap 8 5 - 15    Comment: Performed at [facility], [address], [place], [place] [address]  Troponin I (High Sensitivity)     Status: Abnormal   Collection Time: 02/19/24  5:32 PM  Result Value Ref Range   Troponin I (High Sensitivity) 3,420 (HH) <18 ng/L    Comment: CRITICAL RESULT CALLED TO, READ BACK BY AND VERIFIED WITH SHANMEKA  ARMSTRONG,RN @1835  02/19/2024 VANG.J (NOTE) Elevated high sensitivity troponin I (hsTnI) values and significant  changes across serial measurements may suggest ACS but many other  chronic and acute conditions are known to elevate hsTnI results.  Refer to the "Links" section for chest pain algorithms and additional  guidance. Performed at [facility], [address], [place], [place] [address]   Protime-INR     Status: None   Collection Time: 02/19/24  5:32 PM  Result Value Ref Range   Prothrombin Time 14.4 11.4 - 15.2 seconds   INR 1.1 0.8 - 1.2    Comment: (NOTE) INR goal varies based on device and disease states. Performed at [facility], [address] [place], [place] [address]   Heparin level (unfractionated)     Status: Abnormal   Collection Time: 02/19/24  5:32 PM  Result Value Ref Range   Heparin Unfractionated 0.17 (L) 0.30 - 0.70 IU/mL    Comment: (NOTE) The clinical reportable range upper limit is being lowered to >1.10 to align with the FDA approved guidance for the current laboratory assay.  If heparin results are below expected values, and patient dosage has  been confirmed, suggest follow up testing of antithrombin III levels. Performed at [facility], [address], [place], [place] [address]   HIV Antibody (routine testing w rflx)     Status: None   Collection Time: 02/19/24  5:56 PM  Result Value Ref Range   HIV Screen 4th Generation wRfx Non Reactive Non Reactive     Comment: Performed at [facility], [address] [place], [place] [address]  Heparin level (unfractionated)     Status: Abnormal   Collection Time: 02/20/24 12:39 AM  Result Value Ref Range   Heparin Unfractionated 0.25 (L) 0.30 - 0.70 IU/mL    Comment: (NOTE) The  clinical reportable range upper limit is being lowered to >1.10 to align with the FDA approved guidance for the current laboratory assay.  If heparin results are below expected values, and patient dosage has  been confirmed, suggest follow up testing of antithrombin III levels. Performed at [facility], [address] [place], [place] [address]   CBC     Status: Abnormal   Collection Time: 02/20/24  1:47 AM  Result Value Ref Range   WBC 6.2 4.0 - 10.5 K/uL   RBC 3.58 (L) 4.22 - 5.81 MIL/uL   Hemoglobin 10.9 (L) 13.0 - 17.0 g/dL   HCT 78.4 (L) 69.6 - 29.5 %   MCV 95.3 80.0 - 100.0 fL   MCH 30.4 26.0 - 34.0 pg   MCHC 32.0 30.0 - 36.0 g/dL   RDW 28.4 13.2 - 44.0 %   Platelets 266 150 - 400 K/uL   nRBC 0.0 0.0 - 0.2 %    Comment: Performed at [facility], [address], [place], [place] [address]  Basic metabolic panel     Status: Abnormal   Collection Time: 02/20/24  1:47 AM  Result Value Ref Range   Sodium 139 135 - 145 mmol/L   Potassium 3.3 (L) 3.5 - 5.1 mmol/L   Chloride 102 98 - 111 mmol/L   CO2 27 22 - 32 mmol/L   Glucose, Bld 100 (H) 70 - 99 mg/dL    Comment: Glucose reference range applies only to samples taken after fasting for at least 8 hours.   BUN 12 6 - 20 mg/dL   Creatinine, Ser 5.36 (H) 0.61 - 1.24 mg/dL   Calcium 8.5 (L) 8.9 - 10.3 mg/dL   GFR, Estimated 54 (L) >60 mL/min    Comment: (NOTE) Calculated using the CKD-EPI Creatinine Equation (2021)    Anion gap 10 5 - 15    Comment: Performed at [facility], [address] [place], [place] [address]  Heparin level (unfractionated)     Status: None   Collection Time: 02/20/24  7:55 AM  Result Value Ref Range    Heparin Unfractionated 0.48 0.30 - 0.70 IU/mL    Comment: (NOTE) The clinical reportable range upper limit is being lowered to >1.10 to align with the FDA approved guidance for the current laboratory assay.  If heparin results are below expected values, and patient dosage has  been confirmed, suggest follow up testing of antithrombin III levels. Performed at [facility], [address], [place], [place] [address]     ECG   N/A  Telemetry   Normal sinus rhythm - Personally Reviewed  Radiology    ECHOCARDIOGRAM COMPLETE Result Date: 02/20/2024    ECHOCARDIOGRAM REPORT   Patient Name:   TALIN FEISTER Date of Exam: 02/20/2024 Medical Rec #:  [id number]      Height:       66.0 in Accession #:    [id number]     Weight:       168.0 lb Date of Birth:  [birth year]       BSA:          1.857 m Patient Age:    57 years       BP:           141/94 mmHg Patient Gender: M              HR:           9 bpm. Exam Location:  Inpatient  Procedure: 2D Echo, Cardiac Doppler, Color Doppler and Intracardiac            Opacification Agent (Both Spectral and Color Flow Doppler were            utilized during procedure). Indications:    CHF- Acute Diastolic I50.31  History:        Patient has prior history of Echocardiogram examinations, most                 recent 10/08/2018. CHF, Previous Myocardial Infarction; Risk                 Factors:Hypertension and Current Smoker.  Sonographer:    Lucendia Herrlich RCS Referring Phys: [phone] PREETHA JOSEPH IMPRESSIONS  1. No apical thrombus with Definity contrast. Left ventricular ejection fraction, by estimation, is 30 to 35%. Left ventricular ejection fraction by 2D MOD biplane is 31.1 %. The left ventricle has moderately decreased function. The left ventricle demonstrates global hypokinesis. There is mild left ventricular hypertrophy. Left ventricular diastolic parameters are consistent with Grade III diastolic dysfunction (restrictive). Elevated left ventricular  end-diastolic pressure. The E/e' is 24.  2. Right ventricular systolic function is moderately reduced. The right ventricular size is moderately enlarged. There is severely elevated pulmonary artery systolic pressure. The estimated right ventricular systolic pressure is 67.1 mmHg.  3. Left atrial size was severely dilated.  4. Right atrial size was severely dilated.  5. The mitral valve is abnormal. Moderate to severe mitral valve regurgitation.  6. The aortic valve is tricuspid. Aortic valve regurgitation is not visualized.  7. The inferior vena cava is dilated in size with <50% respiratory variability, suggesting right atrial pressure of 15 mmHg. Comparison(s): Prior images unable to be directly viewed, comparison made by report only. Changes from prior study are noted. 10/08/2018: LVEF 55-60%. FINDINGS  Left Ventricle: No apical thrombus with Definity contrast. Left ventricular ejection fraction, by estimation, is 30 to 35%. Left ventricular ejection fraction by 2D MOD biplane is 31.1 %. The left ventricle has moderately decreased function. The left ventricle demonstrates global hypokinesis. Definity contrast agent was given IV to delineate the left ventricular endocardial borders. Strain imaging was not performed. The left ventricular internal cavity size was normal in size. There is mild left ventricular hypertrophy. Left ventricular diastolic parameters are consistent with Grade III diastolic dysfunction (restrictive). Elevated left ventricular end-diastolic pressure. The E/e' is 24. Right Ventricle: The right ventricular size is moderately enlarged. No increase in right ventricular wall thickness. Right ventricular systolic function is moderately reduced. There is severely elevated pulmonary artery systolic pressure. The tricuspid regurgitant velocity is 3.61 m/s, and with an assumed right atrial pressure of 15 mmHg, the estimated right ventricular systolic pressure is 67.1 mmHg. Left Atrium: Left atrial size  was severely dilated. Right Atrium: Right atrial size was severely dilated. Pericardium: There is no evidence of pericardial effusion. Mitral Valve: The mitral valve is abnormal. There is mild calcification of the anterior and posterior mitral valve leaflet(s). Moderate to severe mitral valve regurgitation, with eccentric laterally directed jet. Tricuspid Valve: The tricuspid valve is grossly normal. Tricuspid valve regurgitation is mild. Aortic Valve: The aortic valve is tricuspid. Aortic valve regurgitation is not visualized. Aortic valve peak gradient measures 5.3 mmHg. Pulmonic Valve: The pulmonic valve was grossly normal. Pulmonic valve regurgitation is trivial. Aorta: The aortic root and ascending aorta are structurally normal, with no evidence of dilitation. Venous: The inferior vena cava is dilated in size with less than 50% respiratory variability,  suggesting right atrial pressure of 15 mmHg. IAS/Shunts: No atrial level shunt detected by color flow Doppler. Additional Comments: 3D imaging was not performed.  LEFT VENTRICLE PLAX 2D                        Biplane EF (MOD) LVIDd:         5.90 cm         LV Biplane EF:   Left LVIDs:         5.00 cm                          ventricular LV PW:         1.20 cm                          ejection LV IVS:        1.20 cm                          fraction by LVOT diam:     2.20 cm                          2D MOD LV SV:         59                               biplane is LV SV Index:   32                               31.1 %. LVOT Area:     3.80 cm                                Diastology                                LV e' medial:    4.90 cm/s LV Volumes (MOD)               LV E/e' medial:  24.1 LV vol d, MOD    204.0 ml      LV e' lateral:   10.00 cm/s A2C:                           LV E/e' lateral: 11.8 LV vol d, MOD    263.0 ml A4C: LV vol s, MOD    154.0 ml A2C: LV vol s, MOD    170.0 ml A4C: LV SV MOD A2C:   50.0 ml LV SV MOD A4C:   263.0 ml LV SV MOD BP:     74.6 ml RIGHT VENTRICLE            IVC RV S prime:     6.96 cm/s  IVC diam: 2.30 cm TAPSE (M-mode): 1.3 cm LEFT ATRIUM             Index        RIGHT ATRIUM           Index LA diam:        4.90 cm 2.64 cm/m  RA Area:     24.60 cm LA Vol (A2C):   81.4 ml 43.84 ml/m  RA Volume:   92.50 ml  49.81 ml/m LA Vol (A4C):   87.4 ml 47.07 ml/m LA Biplane Vol: 88.9 ml 47.87 ml/m  AORTIC VALVE AV Area (Vmax): 3.50 cm AV Vmax:        115.00 cm/s AV Peak Grad:   5.3 mmHg LVOT Vmax:      106.00 cm/s LVOT Vmean:     66.067 cm/s LVOT VTI:       0.154 m  AORTA Ao Root diam: 3.20 cm Ao Asc diam:  3.60 cm MITRAL VALVE                TRICUSPID VALVE MV Area (PHT): 6.65 cm     TR Peak grad:   52.1 mmHg MV Decel Time: 114 msec     TR Vmax:        361.00 cm/s MR Peak grad: 93.5 mmHg MR Vmax:      483.50 cm/s   SHUNTS MV E velocity: 118.00 cm/s  Systemic VTI:  0.15 m MV A velocity: 56.80 cm/s   Systemic Diam: 2.20 cm MV E/A ratio:  2.08 Zoila Shutter MD Electronically signed by Zoila Shutter MD Signature Date/Time: 02/20/2024/9:28:24 AM    Final     Cardiac Studies   See echo above  Assessment   Principal Problem:   CHF (congestive heart failure) (HCC) Active Problems:   NSTEMI (non-ST elevated myocardial infarction) (HCC)   Essential hypertension   Chronic combined systolic (congestive) and diastolic (congestive) heart failure (HCC)   Tobacco abuse   Polysubstance abuse (HCC)   Alcohol abuse   Plan   NSTEMI CAD Elevated troponin at [facility]: 10,777 > 11,329 > 9,186 > 8,104 > 5,846 Awaiting updated troponin at our facility  Coronary CTA (2019) showed moderate RCA stenosis, deemed not significant disease by FFR Patient reports medication non-compliance and frequent cocaine, marijuana and alcohol use  Continue IV heparin  Continue ASA 81 mg daily Continue pravastatin 40 mg daily Continue Imdur 60 mg daily  Plan for [facility] probably Monday.   Acute on chronic combined CHF Echo from 03/2016 showed EF  25-30%, EF then recovered to 55-60% in 09/2018 Echo from 09/2018 also showed: grade 1 diastolic dysfunction, apical hypokinesis, mild LVH He reports medication noncompliance because he states he is going through hard times in his personal life Mild JVD and decreased sounds at lung bases on exam, no LE edema LVEF now 30-35%, global hypokinesis with grade 3 DD  Continue IV Lasix 40 mg IV BID - he has restrictive physiology on echo Strict I/Os, daily weights, daily BMPs Creatinine up to 1.5 (monitor with diuresis) - if this worsens, may need inotropic therapy Slowly initiate and titrate GDMT pending results of echo and labs  AVOID beta-blockers due to frequent cocaine abuse  Plan R/LHC on Monday based on renal function Replete potassium (3.3 today)   Hypertension  Most recent BP 159/119 Patient reports pretty significant noncompliance with his medications Patient also reports frequent polysubstance abuse including alcohol, tobacco, marijuana, cocaine Continue amlodipine 10 mg daily  Continue Imdur 60 mg daily  Holding lisinopril 2/2 AKI  AVOID beta-blockers due to frequent cocaine abuse    Per primary Tobacco abuse Polysubstance abuse Alcohol abuse, possibly requiring CIWA AKI Suspected COPD  Time Spent Directly with Patient:  I have spent a total of 25 minutes with the patient reviewing hospital notes, telemetry, EKGs, labs and examining  the patient as well as establishing an assessment and plan that was discussed personally with the patient.  > 50% of time was spent in direct patient care.  Length of Stay:  LOS: 1 day   Chrystie Nose, MD, [facility], FACP  [place]  [facility]  Medical Director of the [facility]  Cardiovascular Risk [facility] Diplomate of the American Board of Clinical Lipidology Attending Cardiologist  Direct Dial: [phone]  Fax: [phone]  Website:  [email or web address] Nicely Tailor [place] 02/20/2024, 10:01 AM

## 2024-02-20 NOTE — Plan of Care (Signed)
   Problem: Education: Goal: Knowledge of General Education information will improve Description Including pain rating scale, medication(s)/side effects and non-pharmacologic comfort measures Outcome: Progressing

## 2024-02-20 NOTE — Progress Notes (Signed)
 PHARMACY - ANTICOAGULATION CONSULT NOTE  Pharmacy Consult for heparin gtt Indication: chest pain/ACS  No Known Allergies  Patient Measurements: Height: 5\' 6"  (167.6 cm) Weight: 76.2 kg (167 lb 15.9 oz) IBW/kg (Calculated) : 63.8 Heparin Dosing Weight: 77.2 kg  Vital Signs: Temp: 97.5 F (36.4 C) (03/01 0420) Temp Source: Oral (03/01 0420) BP: 141/94 (03/01 0420) Pulse Rate: 92 (03/01 0420)  Labs: Recent Labs    02/19/24 1732 02/20/24 0039 02/20/24 0147 02/20/24 0755  HGB 11.0*  --  10.9*  --   HCT 34.9*  --  34.1*  --   PLT 249  --  266  --   LABPROT 14.4  --   --   --   INR 1.1  --   --   --   HEPARINUNFRC 0.17* 0.25*  --  0.48  CREATININE 1.33*  --  1.50*  --   TROPONINIHS 3,420*  --   --   --     Estimated Creatinine Clearance: 48.4 mL/min (A) (by C-G formula based on SCr of 1.5 mg/dL (H)).   Medical History: Past Medical History:  Diagnosis Date   Alcohol abuse    CAD (coronary artery disease)    Chronic combined systolic and diastolic CHF (congestive heart failure) (HCC)    Cocaine use    Hypertension     Assessment: 58 yo M with concern for NSTEMI. Pharmacy consulted for heparin infusion for anticoagulation. Pt is transfer from [facility]SH. Per RN, patient with heparin running upon transfer to [facility].  Heparin level 0.48, therapeutic with heparin infusion at 1900 units/hour. Hgb 10.9, plt 266, stable. RN stated no issues with running continuously or signs/symptoms of bleeding.  Goal of Therapy:  Heparin level 0.3-0.7 units/ml Monitor platelets by anticoagulation protocol: Yes   Plan:  Continue heparin gtt at 1900 units/hr 6hr confirmatory heparin level   Daily heparin level, CBC F/u longterm anticoag plans and cardiology plans Monitor for signs/symptoms of bleeding  Stephenie Acres, PharmD PGY1 Pharmacy Resident 02/20/2024 9:22 AM

## 2024-02-20 NOTE — Progress Notes (Signed)
 PROGRESS NOTE    Isaac Wilkerson  ZOX:[id number] DOB: 1966/12/22 DOA: 02/19/2024 PCP: Isaac Deiters, MD   57/M with chronic combined CHF with recovered EF, polysubstance abuse including tobacco EtOH and cocaine, nonobstructive CAD presented to ED at [facility] with worsening shortness of breath.  Patient reports some progressive dyspnea over 2 to 3 days, acutely worsened with some orthopnea.  He denied any chest pain. -ED Course: Hypertensive, EKG with LVH, T wave inversion in lateral leads, initial troponin was 10K, peaked at 11K and then started trending down, chest x-ray suggestive of CHF.  Transferred to [facility] for cardiology eval   Subjective: Feels okay, no events overnight, denies chest pain  Assessment and Plan:    NSTEMI (non-ST elevated myocardial infarction) (HCC) -HS troponin peaked at 11K at [facility] R,  -Continue IV heparin, aspirin, statin, Imdur -Cardiology following -Plan for right and left heart cath on Monday   Acute on chronic combined CHF -EF was down to 25-30% in 4/27, subsequently recovered to 65-70% in late 2017 and 2019 -Repeat echo with EF down to 30-35%, global hypokinesis, grade 3 DD -Continue IV Lasix today -Slowly add GDMT as blood pressure and kidney function tolerates -Plan for right and left heart cath on Monday   Essential hypertension -Continue amlodipine,  -Diuretics as noted above   Tobacco abuse   Polysubstance abuse (HCC)   Alcohol abuse -Counseled   Mild AKI,  -Creatinine 1.37 at [facility] ED yesterday, slightly higher than baseline -Monitor   Suspected COPD -Poor air movement, no wheezing at this time, monitor     DVT prophylaxis: IV heparin Code Status: Full code Family Communication: None present Disposition Plan: Home pending above workup  Consultants:    Procedures:   Antimicrobials:    Objective: Vitals:   02/19/24 1900 02/19/24 2019 02/19/24 2347 02/20/24 0420  BP: (!) 155/110 (!) 162/99 (!) 155/88 (!)  141/94  Pulse: 96  (!) 109 92  Resp: 17 19 19 20   Temp: 97.7 F (36.5 C) 98.1 F (36.7 C) 98 F (36.7 C) (!) 97.5 F (36.4 C)  TempSrc: Oral Oral Oral Oral  SpO2: 98% 97% 98% 93%  Weight:    76.2 kg  Height:        Intake/Output Summary (Last 24 hours) at 02/20/2024 1130 Last data filed at 02/20/2024 8119 Gross per 24 hour  Intake 909.71 ml  Output 2000 ml  Net -1090.29 ml   Filed Weights   02/19/24 1654 02/20/24 0420  Weight: 77.2 kg 76.2 kg    Examination:  General exam: Appears calm and comfortable  HEENT: Positive JVD Respiratory system: Clear to auscultation Cardiovascular system: S1 & S2 heard, RRR.  Abd: nondistended, soft and nontender.Normal bowel sounds heard. Central nervous system: Alert and oriented. No focal neurological deficits. Extremities: no edema Skin: No rashes Psychiatry:  Mood & affect appropriate.     Data Reviewed:   CBC: Recent Labs  Lab 02/19/24 1732 02/20/24 0147  WBC 5.4 6.2  HGB 11.0* 10.9*  HCT 34.9* 34.1*  MCV 95.6 95.3  PLT 249 266   Basic Metabolic Panel: Recent Labs  Lab 02/19/24 1732 02/20/24 0147  NA 140 139  K 4.1 3.3*  CL 105 102  CO2 27 27  GLUCOSE 108* 100*  BUN 13 12  CREATININE 1.33* 1.50*  CALCIUM 8.7* 8.5*   GFR: Estimated Creatinine Clearance: 48.4 mL/min (A) (by C-G formula based on SCr of 1.5 mg/dL (H)). Liver Function Tests: Recent Labs  Lab 02/19/24 1732  AST 21  ALT 19  ALKPHOS 50  BILITOT 1.6*  PROT 6.4*  ALBUMIN 2.9*   No results for input(s): "LIPASE", "AMYLASE" in the last 168 hours. No results for input(s): "AMMONIA" in the last 168 hours. Coagulation Profile: Recent Labs  Lab 02/19/24 1732  INR 1.1   Cardiac Enzymes: No results for input(s): "CKTOTAL", "CKMB", "CKMBINDEX", "TROPONINI" in the last 168 hours. BNP (last 3 results) No results for input(s): "PROBNP" in the last 8760 hours. HbA1C: No results for input(s): "HGBA1C" in the last 72 hours. CBG: Recent Labs  Lab  02/19/24 1624  GLUCAP 90   Lipid Profile: No results for input(s): "CHOL", "HDL", "LDLCALC", "TRIG", "CHOLHDL", "LDLDIRECT" in the last 72 hours. Thyroid Function Tests: No results for input(s): "TSH", "T4TOTAL", "FREET4", "T3FREE", "THYROIDAB" in the last 72 hours. Anemia Panel: No results for input(s): "VITAMINB12", "FOLATE", "FERRITIN", "TIBC", "IRON", "RETICCTPCT" in the last 72 hours. Urine analysis: No results found for: "COLORURINE", "APPEARANCEUR", "LABSPEC", "PHURINE", "GLUCOSEU", "HGBUR", "BILIRUBINUR", "KETONESUR", "PROTEINUR", "UROBILINOGEN", "NITRITE", "LEUKOCYTESUR" Sepsis Labs: @LABRCNTIP (procalcitonin:4,lacticidven:4)  )No results found for this or any previous visit (from the past 240 hours).   Radiology Studies: ECHOCARDIOGRAM COMPLETE Result Date: 02/20/2024    ECHOCARDIOGRAM REPORT   Patient Name:   Isaac Wilkerson Date of Exam: 02/20/2024 Medical Rec #:  [id number]      Height:       66.0 in Accession #:    [id number]     Weight:       168.0 lb Date of Birth:  [birth year]       BSA:          1.857 m Patient Age:    57 years       BP:           141/94 mmHg Patient Gender: M              HR:           9 bpm. Exam Location:  Inpatient Procedure: 2D Echo, Cardiac Doppler, Color Doppler and Intracardiac            Opacification Agent (Both Spectral and Color Flow Doppler were            utilized during procedure). Indications:    CHF- Acute Diastolic I50.31  History:        Patient has prior history of Echocardiogram examinations, most                 recent 10/08/2018. CHF, Previous Myocardial Infarction; Risk                 Factors:Hypertension and Current Smoker.  Sonographer:    Isaac Wilkerson RCS Referring Phys: [phone] Isaac Wilkerson IMPRESSIONS  1. No apical thrombus with Definity contrast. Left ventricular ejection fraction, by estimation, is 30 to 35%. Left ventricular ejection fraction by 2D MOD biplane is 31.1 %. The left ventricle has moderately decreased function. The left  ventricle demonstrates global hypokinesis. There is mild left ventricular hypertrophy. Left ventricular diastolic parameters are consistent with Grade III diastolic dysfunction (restrictive). Elevated left ventricular end-diastolic pressure. The E/e' is 24.  2. Right ventricular systolic function is moderately reduced. The right ventricular size is moderately enlarged. There is severely elevated pulmonary artery systolic pressure. The estimated right ventricular systolic pressure is 67.1 mmHg.  3. Left atrial size was severely dilated.  4. Right atrial size was severely dilated.  5. The mitral valve is abnormal. Moderate to severe mitral valve regurgitation.  6. The aortic  valve is tricuspid. Aortic valve regurgitation is not visualized.  7. The inferior vena cava is dilated in size with <50% respiratory variability, suggesting right atrial pressure of 15 mmHg. Comparison(s): Prior images unable to be directly viewed, comparison made by report only. Changes from prior study are noted. 10/08/2018: LVEF 55-60%. FINDINGS  Left Ventricle: No apical thrombus with Definity contrast. Left ventricular ejection fraction, by estimation, is 30 to 35%. Left ventricular ejection fraction by 2D MOD biplane is 31.1 %. The left ventricle has moderately decreased function. The left ventricle demonstrates global hypokinesis. Definity contrast agent was given IV to delineate the left ventricular endocardial borders. Strain imaging was not performed. The left ventricular internal cavity size was normal in size. There is mild left ventricular hypertrophy. Left ventricular diastolic parameters are consistent with Grade III diastolic dysfunction (restrictive). Elevated left ventricular end-diastolic pressure. The E/e' is 24. Right Ventricle: The right ventricular size is moderately enlarged. No increase in right ventricular wall thickness. Right ventricular systolic function is moderately reduced. There is severely elevated pulmonary  artery systolic pressure. The tricuspid regurgitant velocity is 3.61 m/s, and with an assumed right atrial pressure of 15 mmHg, the estimated right ventricular systolic pressure is 67.1 mmHg. Left Atrium: Left atrial size was severely dilated. Right Atrium: Right atrial size was severely dilated. Pericardium: There is no evidence of pericardial effusion. Mitral Valve: The mitral valve is abnormal. There is mild calcification of the anterior and posterior mitral valve leaflet(s). Moderate to severe mitral valve regurgitation, with eccentric laterally directed jet. Tricuspid Valve: The tricuspid valve is grossly normal. Tricuspid valve regurgitation is mild. Aortic Valve: The aortic valve is tricuspid. Aortic valve regurgitation is not visualized. Aortic valve peak gradient measures 5.3 mmHg. Pulmonic Valve: The pulmonic valve was grossly normal. Pulmonic valve regurgitation is trivial. Aorta: The aortic root and ascending aorta are structurally normal, with no evidence of dilitation. Venous: The inferior vena cava is dilated in size with less than 50% respiratory variability, suggesting right atrial pressure of 15 mmHg. IAS/Shunts: No atrial level shunt detected by color flow Doppler. Additional Comments: 3D imaging was not performed.  LEFT VENTRICLE PLAX 2D                        Biplane EF (MOD) LVIDd:         5.90 cm         LV Biplane EF:   Left LVIDs:         5.00 cm                          ventricular LV PW:         1.20 cm                          ejection LV IVS:        1.20 cm                          fraction by LVOT diam:     2.20 cm                          2D MOD LV SV:         59  biplane is LV SV Index:   32                               31.1 %. LVOT Area:     3.80 cm                                Diastology                                LV e' medial:    4.90 cm/s LV Volumes (MOD)               LV E/e' medial:  24.1 LV vol d, MOD    204.0 ml      LV e' lateral:   10.00  cm/s A2C:                           LV E/e' lateral: 11.8 LV vol d, MOD    263.0 ml A4C: LV vol s, MOD    154.0 ml A2C: LV vol s, MOD    170.0 ml A4C: LV SV MOD A2C:   50.0 ml LV SV MOD A4C:   263.0 ml LV SV MOD BP:    74.6 ml RIGHT VENTRICLE            IVC RV S prime:     6.96 cm/s  IVC diam: 2.30 cm TAPSE (M-mode): 1.3 cm LEFT ATRIUM             Index        RIGHT ATRIUM           Index LA diam:        4.90 cm 2.64 cm/m   RA Area:     24.60 cm LA Vol (A2C):   81.4 ml 43.84 ml/m  RA Volume:   92.50 ml  49.81 ml/m LA Vol (A4C):   87.4 ml 47.07 ml/m LA Biplane Vol: 88.9 ml 47.87 ml/m  AORTIC VALVE AV Area (Vmax): 3.50 cm AV Vmax:        115.00 cm/s AV Peak Grad:   5.3 mmHg LVOT Vmax:      106.00 cm/s LVOT Vmean:     66.067 cm/s LVOT VTI:       0.154 m  AORTA Ao Root diam: 3.20 cm Ao Asc diam:  3.60 cm MITRAL VALVE                TRICUSPID VALVE MV Area (PHT): 6.65 cm     TR Peak grad:   52.1 mmHg MV Decel Time: 114 msec     TR Vmax:        361.00 cm/s MR Peak grad: 93.5 mmHg MR Vmax:      483.50 cm/s   SHUNTS MV E velocity: 118.00 cm/s  Systemic VTI:  0.15 m MV A velocity: 56.80 cm/s   Systemic Diam: 2.20 cm MV E/A ratio:  2.08 Zoila Shutter MD Electronically signed by Zoila Shutter MD Signature Date/Time: 02/20/2024/9:28:24 AM    Final      Scheduled Meds:  amLODipine  10 mg Oral Daily   aspirin EC  81 mg Oral Daily   feeding supplement  237 mL Oral BID BM   furosemide  40 mg Intravenous BID   isosorbide  mononitrate  60 mg Oral Daily   potassium chloride  40 mEq Oral BID   pravastatin  40 mg Oral QPM   thiamine  100 mg Oral Daily   Continuous Infusions:  heparin 1,900 Units/hr (02/20/24 0144)     LOS: 1 day    Time spent:    Zannie Cove, MD [facility]   02/20/2024, 11:30 AM

## 2024-02-20 NOTE — Progress Notes (Signed)
 PHARMACY - ANTICOAGULATION CONSULT NOTE  Pharmacy Consult for heparin gtt Indication: chest pain/ACS  No Known Allergies  Patient Measurements: Height: 5\' 6"  (167.6 cm) Weight: 76.2 kg (167 lb 15.9 oz) IBW/kg (Calculated) : 63.8 Heparin Dosing Weight: 77.2 kg  Vital Signs: Temp: 97.4 F (36.3 C) (03/01 1130) Temp Source: Oral (03/01 1130) BP: 141/94 (03/01 0420) Pulse Rate: 92 (03/01 0420)  Labs: Recent Labs    02/19/24 1732 02/20/24 0039 02/20/24 0147 02/20/24 0755 02/20/24 1507  HGB 11.0*  --  10.9*  --   --   HCT 34.9*  --  34.1*  --   --   PLT 249  --  266  --   --   LABPROT 14.4  --   --   --   --   INR 1.1  --   --   --   --   HEPARINUNFRC 0.17* 0.25*  --  0.48 0.55  CREATININE 1.33*  --  1.50*  --   --   TROPONINIHS 3,420*  --   --   --   --     Estimated Creatinine Clearance: 48.4 mL/min (A) (by C-G formula based on SCr of 1.5 mg/dL (H)).   Medical History: Past Medical History:  Diagnosis Date   Alcohol abuse    CAD (coronary artery disease)    Chronic combined systolic and diastolic CHF (congestive heart failure) (HCC)    Cocaine use    Hypertension     Assessment: 58 yo M with concern for NSTEMI. Pharmacy consulted for heparin infusion for anticoagulation. Pt is transfer from OSH. Per RN, patient with heparin running upon transfer to [facility].  Heparin level therapeutic again this PM. Continue current rate and check in AM.   Goal of Therapy:  Heparin level 0.3-0.7 units/ml Monitor platelets by anticoagulation protocol: Yes   Plan:  Continue heparin gtt at 1900 units/hr Daily heparin level, CBC F/u longterm anticoag plans and cardiology plans Monitor for signs/symptoms of bleeding  Ulyses Southward, PharmD, BCIDP, AAHIVP, CPP Infectious Disease Pharmacist 02/20/2024 3:50 PM

## 2024-02-20 NOTE — Progress Notes (Signed)
 PHARMACY - ANTICOAGULATION CONSULT NOTE  Pharmacy Consult for heparin gtt Indication: chest pain/ACS  No Known Allergies  Patient Measurements: Height: 5\' 6"  (167.6 cm) Weight: 77.2 kg (170 lb 3.1 oz) IBW/kg (Calculated) : 63.8 Heparin Dosing Weight: 77.2 kg  Vital Signs: Temp: 98 F (36.7 C) (02/28 2347) Temp Source: Oral (02/28 2347) BP: 155/88 (02/28 2347) Pulse Rate: 109 (02/28 2347)  Labs: Recent Labs    02/19/24 1732 02/20/24 0039  HGB 11.0*  --   HCT 34.9*  --   PLT 249  --   LABPROT 14.4  --   INR 1.1  --   HEPARINUNFRC 0.17* 0.25*  CREATININE 1.33*  --   TROPONINIHS 3,420*  --     Estimated Creatinine Clearance: 59.3 mL/min (A) (by C-G formula based on SCr of 1.33 mg/dL (H)).   Medical History: Past Medical History:  Diagnosis Date   Alcohol abuse    CAD (coronary artery disease)    Chronic combined systolic and diastolic CHF (congestive heart failure) (HCC)    Cocaine use    Hypertension     Assessment: 58 yo M with concern for NSTEMI. Pharmacy consulted for heparin infusion for anticoagulation. Pt is transfer from OSH. Per RN, patient with heparin running upon transfer to [facility].  Heparin currently running at 1750 units/hr  HL 0.25 - subtherapeutic at current rate (drawn ~5h after rate change) RN stated no issues with running continuously or signs/symptoms of bleeding  Goal of Therapy:  Heparin level 0.3-0.7 units/ml Monitor platelets by anticoagulation protocol: Yes   Plan:  Increase heparin to 1900 units/hr 6hr heparin level   Daily heparin level, CBC F/u longterm anticoag plans and cardiology plans  Arabella Merles, PharmD. Clinical Pharmacist 02/20/2024 1:06 AM

## 2024-02-21 DIAGNOSIS — I5043 Acute on chronic combined systolic (congestive) and diastolic (congestive) heart failure: Secondary | ICD-10-CM | POA: Diagnosis not present

## 2024-02-21 DIAGNOSIS — I214 Non-ST elevation (NSTEMI) myocardial infarction: Secondary | ICD-10-CM

## 2024-02-21 DIAGNOSIS — F1910 Other psychoactive substance abuse, uncomplicated: Secondary | ICD-10-CM

## 2024-02-21 LAB — BASIC METABOLIC PANEL
Anion gap: 11 (ref 5–15)
BUN: 17 mg/dL (ref 6–20)
CO2: 26 mmol/L (ref 22–32)
Calcium: 9.5 mg/dL (ref 8.9–10.3)
Chloride: 103 mmol/L (ref 98–111)
Creatinine, Ser: 1.33 mg/dL — ABNORMAL HIGH (ref 0.61–1.24)
GFR, Estimated: >60 mL/min (ref >60)
Glucose, Bld: 125 mg/dL — ABNORMAL HIGH (ref 70–99)
Potassium: 3.9 mmol/L (ref 3.5–5.1)
Sodium: 140 mmol/L (ref 135–145)

## 2024-02-21 LAB — CBC
HCT: 34.2 % — ABNORMAL LOW (ref 39.0–52.0)
Hemoglobin: 10.7 g/dL — ABNORMAL LOW (ref 13.0–17.0)
MCH: 30 pg (ref 26.0–34.0)
MCHC: 31.3 g/dL (ref 30.0–36.0)
MCV: 95.8 fL (ref 80.0–100.0)
Platelets: 262 10*3/uL (ref 150–400)
RBC: 3.57 MIL/uL — ABNORMAL LOW (ref 4.22–5.81)
RDW: 13.6 % (ref 11.5–15.5)
WBC: 6.1 10*3/uL (ref 4.0–10.5)
nRBC: 0 % (ref 0.0–0.2)

## 2024-02-21 LAB — HEPARIN LEVEL (UNFRACTIONATED): Heparin Unfractionated: 0.67 [IU]/mL (ref 0.30–0.70)

## 2024-02-21 MED ORDER — FUROSEMIDE 10 MG/ML IJ SOLN: 40.0000 mg | Freq: Once | INTRAMUSCULAR | Status: AC

## 2024-02-21 MED ORDER — SODIUM CHLORIDE 0.9 % IV SOLN: INTRAVENOUS | Status: DC

## 2024-02-21 MED ORDER — FUROSEMIDE 10 MG/ML IJ SOLN
INTRAMUSCULAR | Status: AC
Start: 2024-02-21 — End: 2024-02-21
  Administered 2024-02-21: 40 mg via INTRAVENOUS
  Filled 2024-02-21: qty 4

## 2024-02-21 MED ORDER — FUROSEMIDE 10 MG/ML IJ SOLN
80.0000 mg | Freq: Two times a day (BID) | INTRAMUSCULAR | Status: DC
  Administered 2024-02-21: 80 mg via INTRAVENOUS
  Filled 2024-02-21: qty 8

## 2024-02-21 MED ORDER — METOLAZONE 2.5 MG PO TABS: 2.5000 mg | ORAL_TABLET | Freq: Once | ORAL | Status: DC

## 2024-02-21 NOTE — Plan of Care (Signed)

## 2024-02-21 NOTE — Progress Notes (Signed)
 PROGRESS NOTE    Isaac Wilkerson  UEA:[id number] DOB: 04/09/1966 DOA: 02/19/2024 PCP: Toma Deiters, MD   57/M with chronic combined CHF with recovered EF, polysubstance abuse including tobacco EtOH and cocaine, nonobstructive CAD presented to ED at [facility] with worsening shortness of breath.  Patient reports some progressive dyspnea over 2 to 3 days, acutely worsened with some orthopnea.  He denied any chest pain. -ED Course: Hypertensive, EKG with LVH, T wave inversion in lateral leads, initial troponin was 10K, peaked at 11K and then started trending down, chest x-ray suggestive of CHF.  Transferred to [facility] for cardiology eval   Subjective: Needs better overall,  breathing better  Assessment and Plan:    NSTEMI (non-ST elevated myocardial infarction) (HCC) -HS troponin peaked at 11K at [facility],  -Continue IV heparin, aspirin, statin, Imdur -Cardiology following -Plan for right and left heart cath on Monday   Acute on chronic combined CHF -EF was down to 25-30% in 4/27, subsequently recovered to 65-70% in late 2017 and 2019 -Repeat echo with EF down to 30-35%, global hypokinesis, grade 3 DD -Continue IV Lasix today, creatinine improving, add Jardiance tomorrow -Plan for right and left heart cath on Monday   Essential hypertension -Continue Imdur, discontinue amlodipine -Diuretics as noted above   Tobacco abuse   Polysubstance abuse (HCC)   Alcohol abuse -Counseled   Mild AKI,  -Creatinine 1.37 at [facility] ED yesterday, slightly higher than baseline -Monitor   Suspected COPD -Poor air movement, no wheezing at this time, monitor     DVT prophylaxis: IV heparin Code Status: Full code Family Communication: None present Disposition Plan: Home pending above workup  Consultants:    Procedures:   Antimicrobials:    Objective: Vitals:   02/20/24 2315 02/21/24 0443 02/21/24 0450 02/21/24 0741  BP: (!) 141/89  133/89 (!) 153/106  Pulse: 87  89 90  Resp:  17  18 20   Temp: 98.4 F (36.9 C)  98.1 F (36.7 C) 97.8 F (36.6 C)  TempSrc: Oral  Oral Oral  SpO2: 93%  100% 98%  Weight:  76.6 kg    Height:        Intake/Output Summary (Last 24 hours) at 02/21/2024 1048 Last data filed at 02/21/2024 0949 Gross per 24 hour  Intake 2126.42 ml  Output 2650 ml  Net -523.58 ml   Filed Weights   02/19/24 1654 02/20/24 0420 02/21/24 0443  Weight: 77.2 kg 76.2 kg 76.6 kg    Examination:  General exam: Appears calm and comfortable  HEENT: Positive JVD Respiratory system: Clear Cardiovascular system: S1-S2, regular rhythm Abd: nondistended, soft and nontender.Normal bowel sounds heard. Central nervous system: Alert and oriented. No focal neurological deficits. Extremities: No edema Skin: No rashes Psychiatry:  Mood & affect appropriate.     Data Reviewed:   CBC: Recent Labs  Lab 02/19/24 1732 02/20/24 0147 02/21/24 0236  WBC 5.4 6.2 6.1  HGB 11.0* 10.9* 10.7*  HCT 34.9* 34.1* 34.2*  MCV 95.6 95.3 95.8  PLT 249 266 262   Basic Metabolic Panel: Recent Labs  Lab 02/19/24 1732 02/20/24 0147 02/21/24 0236  NA 140 139 140  K 4.1 3.3* 3.9  CL 105 102 103  CO2 27 27 26   GLUCOSE 108* 100* 125*  BUN 13 12 17   CREATININE 1.33* 1.50* 1.33*  CALCIUM 8.7* 8.5* 9.5   GFR: Estimated Creatinine Clearance: 59 mL/min (A) (by C-G formula based on SCr of 1.33 mg/dL (H)). Liver Function Tests: Recent Labs  Lab 02/19/24 1732  AST 21  ALT 19  ALKPHOS 50  BILITOT 1.6*  PROT 6.4*  ALBUMIN 2.9*   No results for input(s): "LIPASE", "AMYLASE" in the last 168 hours. No results for input(s): "AMMONIA" in the last 168 hours. Coagulation Profile: Recent Labs  Lab 02/19/24 1732  INR 1.1   Cardiac Enzymes: No results for input(s): "CKTOTAL", "CKMB", "CKMBINDEX", "TROPONINI" in the last 168 hours. BNP (last 3 results) No results for input(s): "PROBNP" in the last 8760 hours. HbA1C: No results for input(s): "HGBA1C" in the last 72  hours. CBG: Recent Labs  Lab 02/19/24 1624  GLUCAP 90   Lipid Profile: No results for input(s): "CHOL", "HDL", "LDLCALC", "TRIG", "CHOLHDL", "LDLDIRECT" in the last 72 hours. Thyroid Function Tests: No results for input(s): "TSH", "T4TOTAL", "FREET4", "T3FREE", "THYROIDAB" in the last 72 hours. Anemia Panel: No results for input(s): "VITAMINB12", "FOLATE", "FERRITIN", "TIBC", "IRON", "RETICCTPCT" in the last 72 hours. Urine analysis: No results found for: "COLORURINE", "APPEARANCEUR", "LABSPEC", "PHURINE", "GLUCOSEU", "HGBUR", "BILIRUBINUR", "KETONESUR", "PROTEINUR", "UROBILINOGEN", "NITRITE", "LEUKOCYTESUR" Sepsis Labs: @LABRCNTIP (procalcitonin:4,lacticidven:4)  )No results found for this or any previous visit (from the past 240 hours).   Radiology Studies: ECHOCARDIOGRAM COMPLETE Result Date: 02/20/2024    ECHOCARDIOGRAM REPORT   Patient Name:   Isaac Wilkerson Date of Exam: 02/20/2024 Medical Rec #:  [id number]      Height:       66.0 in Accession #:    [id number]     Weight:       168.0 lb Date of Birth:  [birth year]       BSA:          1.857 m Patient Age:    57 years       BP:           141/94 mmHg Patient Gender: M              HR:           9 bpm. Exam Location:  Inpatient Procedure: 2D Echo, Cardiac Doppler, Color Doppler and Intracardiac            Opacification Agent (Both Spectral and Color Flow Doppler were            utilized during procedure). Indications:    CHF- Acute Diastolic I50.31  History:        Patient has prior history of Echocardiogram examinations, most                 recent 10/08/2018. CHF, Previous Myocardial Infarction; Risk                 Factors:Hypertension and Current Smoker.  Sonographer:    Lucendia Herrlich RCS Referring Phys: [phone] Kalen Neidert IMPRESSIONS  1. No apical thrombus with Definity contrast. Left ventricular ejection fraction, by estimation, is 30 to 35%. Left ventricular ejection fraction by 2D MOD biplane is 31.1 %. The left ventricle has moderately  decreased function. The left ventricle demonstrates global hypokinesis. There is mild left ventricular hypertrophy. Left ventricular diastolic parameters are consistent with Grade III diastolic dysfunction (restrictive). Elevated left ventricular end-diastolic pressure. The E/e' is 24.  2. Right ventricular systolic function is moderately reduced. The right ventricular size is moderately enlarged. There is severely elevated pulmonary artery systolic pressure. The estimated right ventricular systolic pressure is 67.1 mmHg.  3. Left atrial size was severely dilated.  4. Right atrial size was severely dilated.  5. The mitral valve is abnormal. Moderate to severe mitral valve regurgitation.  6. The aortic valve is tricuspid. Aortic valve regurgitation is not visualized.  7. The inferior vena cava is dilated in size with <50% respiratory variability, suggesting right atrial pressure of 15 mmHg. Comparison(s): Prior images unable to be directly viewed, comparison made by report only. Changes from prior study are noted. 10/08/2018: LVEF 55-60%. FINDINGS  Left Ventricle: No apical thrombus with Definity contrast. Left ventricular ejection fraction, by estimation, is 30 to 35%. Left ventricular ejection fraction by 2D MOD biplane is 31.1 %. The left ventricle has moderately decreased function. The left ventricle demonstrates global hypokinesis. Definity contrast agent was given IV to delineate the left ventricular endocardial borders. Strain imaging was not performed. The left ventricular internal cavity size was normal in size. There is mild left ventricular hypertrophy. Left ventricular diastolic parameters are consistent with Grade III diastolic dysfunction (restrictive). Elevated left ventricular end-diastolic pressure. The E/e' is 24. Right Ventricle: The right ventricular size is moderately enlarged. No increase in right ventricular wall thickness. Right ventricular systolic function is moderately reduced. There is  severely elevated pulmonary artery systolic pressure. The tricuspid regurgitant velocity is 3.61 m/s, and with an assumed right atrial pressure of 15 mmHg, the estimated right ventricular systolic pressure is 67.1 mmHg. Left Atrium: Left atrial size was severely dilated. Right Atrium: Right atrial size was severely dilated. Pericardium: There is no evidence of pericardial effusion. Mitral Valve: The mitral valve is abnormal. There is mild calcification of the anterior and posterior mitral valve leaflet(s). Moderate to severe mitral valve regurgitation, with eccentric laterally directed jet. Tricuspid Valve: The tricuspid valve is grossly normal. Tricuspid valve regurgitation is mild. Aortic Valve: The aortic valve is tricuspid. Aortic valve regurgitation is not visualized. Aortic valve peak gradient measures 5.3 mmHg. Pulmonic Valve: The pulmonic valve was grossly normal. Pulmonic valve regurgitation is trivial. Aorta: The aortic root and ascending aorta are structurally normal, with no evidence of dilitation. Venous: The inferior vena cava is dilated in size with less than 50% respiratory variability, suggesting right atrial pressure of 15 mmHg. IAS/Shunts: No atrial level shunt detected by color flow Doppler. Additional Comments: 3D imaging was not performed.  LEFT VENTRICLE PLAX 2D                        Biplane EF (MOD) LVIDd:         5.90 cm         LV Biplane EF:   Left LVIDs:         5.00 cm                          ventricular LV PW:         1.20 cm                          ejection LV IVS:        1.20 cm                          fraction by LVOT diam:     2.20 cm                          2D MOD LV SV:         59  biplane is LV SV Index:   32                               31.1 %. LVOT Area:     3.80 cm                                Diastology                                LV e' medial:    4.90 cm/s LV Volumes (MOD)               LV E/e' medial:  24.1 LV vol d, MOD    204.0 ml       LV e' lateral:   10.00 cm/s A2C:                           LV E/e' lateral: 11.8 LV vol d, MOD    263.0 ml A4C: LV vol s, MOD    154.0 ml A2C: LV vol s, MOD    170.0 ml A4C: LV SV MOD A2C:   50.0 ml LV SV MOD A4C:   263.0 ml LV SV MOD BP:    74.6 ml RIGHT VENTRICLE            IVC RV S prime:     6.96 cm/s  IVC diam: 2.30 cm TAPSE (M-mode): 1.3 cm LEFT ATRIUM             Index        RIGHT ATRIUM           Index LA diam:        4.90 cm 2.64 cm/m   RA Area:     24.60 cm LA Vol (A2C):   81.4 ml 43.84 ml/m  RA Volume:   92.50 ml  49.81 ml/m LA Vol (A4C):   87.4 ml 47.07 ml/m LA Biplane Vol: 88.9 ml 47.87 ml/m  AORTIC VALVE AV Area (Vmax): 3.50 cm AV Vmax:        115.00 cm/s AV Peak Grad:   5.3 mmHg LVOT Vmax:      106.00 cm/s LVOT Vmean:     66.067 cm/s LVOT VTI:       0.154 m  AORTA Ao Root diam: 3.20 cm Ao Asc diam:  3.60 cm MITRAL VALVE                TRICUSPID VALVE MV Area (PHT): 6.65 cm     TR Peak grad:   52.1 mmHg MV Decel Time: 114 msec     TR Vmax:        361.00 cm/s MR Peak grad: 93.5 mmHg MR Vmax:      483.50 cm/s   SHUNTS MV E velocity: 118.00 cm/s  Systemic VTI:  0.15 m MV A velocity: 56.80 cm/s   Systemic Diam: 2.20 cm MV E/A ratio:  2.08 Zoila Shutter MD Electronically signed by Zoila Shutter MD Signature Date/Time: 02/20/2024/9:28:24 AM    Final      Scheduled Meds:  amLODipine  10 mg Oral Daily   aspirin EC  81 mg Oral Daily   feeding supplement  237 mL Oral BID BM   furosemide  80 mg Intravenous BID   isosorbide  mononitrate  60 mg Oral Daily   pravastatin  40 mg Oral QPM   thiamine  100 mg Oral Daily   Continuous Infusions:  heparin 1,900 Units/hr (02/21/24 0628)     LOS: 2 days    Time spent:    Zannie Cove, MD [facility]   02/21/2024, 10:48 AM

## 2024-02-21 NOTE — Progress Notes (Signed)
 PHARMACY - ANTICOAGULATION CONSULT NOTE  Pharmacy Consult for heparin gtt Indication: chest pain/ACS  No Known Allergies  Patient Measurements: Height: 5\' 6"  (167.6 cm) Weight: 76.6 kg (168 lb 14 oz) IBW/kg (Calculated) : 63.8 Heparin Dosing Weight: 77.2 kg  Vital Signs: Temp: 98.1 F (36.7 C) (03/02 0450) Temp Source: Oral (03/02 0450) BP: 133/89 (03/02 0450) Pulse Rate: 89 (03/02 0450)  Labs: Recent Labs    02/19/24 1732 02/20/24 0039 02/20/24 0147 02/20/24 0755 02/20/24 1507 02/21/24 0236  HGB 11.0*  --  10.9*  --   --  10.7*  HCT 34.9*  --  34.1*  --   --  34.2*  PLT 249  --  266  --   --  262  LABPROT 14.4  --   --   --   --   --   INR 1.1  --   --   --   --   --   HEPARINUNFRC 0.17*   < >  --  0.48 0.55 0.67  CREATININE 1.33*  --  1.50*  --   --  1.33*  TROPONINIHS 3,420*  --   --   --   --   --    < > = values in this interval not displayed.    Estimated Creatinine Clearance: 59 mL/min (A) (by C-G formula based on SCr of 1.33 mg/dL (H)).   Medical History: Past Medical History:  Diagnosis Date   Alcohol abuse    CAD (coronary artery disease)    Chronic combined systolic and diastolic CHF (congestive heart failure) (HCC)    Cocaine use    Hypertension     Assessment: 58 yo M with concern for NSTEMI. Pharmacy consulted for heparin infusion for anticoagulation. Pt is transfer from OSH. Per RN, patient with heparin running upon transfer to [facility].  Heparin level 0.67, therapeutic with heparin infusion at 1900 units/hour. Hgb 10.7, plt 262, stable. RN stated no issues with running continuously or signs/symptoms of bleeding.   Goal of Therapy:  Heparin level 0.3-0.7 units/ml Monitor platelets by anticoagulation protocol: Yes   Plan:  Continue heparin gtt at 1900 units/hr Daily heparin level, CBC F/u longterm anticoag plans and cardiology plans Monitor for signs/symptoms of bleeding  Stephenie Acres, PharmD PGY1 Pharmacy Resident 02/21/2024 7:30  AM

## 2024-02-21 NOTE — H&P (View-Only) (Signed)
 DAILY PROGRESS NOTE   Patient Name: Isaac Wilkerson Date of Encounter: 02/21/2024 Cardiologist: Dina Rich, MD  Chief Complaint   Breathing has improved  Patient Profile   Isaac Wilkerson is a 58 y.o. male with a hx of hypertension, chronic combined CHF, history of NSTEMI 09/2018, tobacco use, polysubstance abuse who is being seen 02/19/2024 for the evaluation of NSTEMI, acute on chronic CHF at the request of Dr. Jomarie Longs.   Subjective   Minimally net negative with diuresis overnight- creatinine improved at 1.33. Echo yesterday (personally read) shows LVEF 30-35%, global hypokinesis and restrictive filling pressures and severe pulmonary hypertension. Weight unchanged, around 76 kg.   Objective   Vitals:   02/20/24 2315 02/21/24 0443 02/21/24 0450 02/21/24 0741  BP: (!) 141/89  133/89 (!) 153/106  Pulse: 87  89 90  Resp: 17  18 20   Temp: 98.4 F (36.9 C)  98.1 F (36.7 C) 97.8 F (36.6 C)  TempSrc: Oral  Oral Oral  SpO2: 93%  100% 98%  Weight:  76.6 kg    Height:        Intake/Output Summary (Last 24 hours) at 02/21/2024 0850 Last data filed at 02/21/2024 2956 Gross per 24 hour  Intake 1412.42 ml  Output 1675 ml  Net -262.58 ml   Filed Weights   02/19/24 1654 02/20/24 0420 02/21/24 0443  Weight: 77.2 kg 76.2 kg 76.6 kg    Physical Exam   General appearance: alert and no distress Neck: JVD - several cm above sternal notch and thyroid not enlarged, symmetric, no tenderness/mass/nodules Lungs: diminished breath sounds bibasilar Heart: regular rate and rhythm Abdomen: soft, non-tender; bowel sounds normal; no masses,  no organomegaly Extremities: extremities normal, atraumatic, no cyanosis or edema Pulses: 2+ and symmetric Skin: Skin color, texture, turgor normal. No rashes or lesions Neurologic: Grossly normal Psych: Pleasant  Inpatient Medications    Scheduled Meds:  amLODipine  10 mg Oral Daily   aspirin EC  81 mg Oral Daily   feeding supplement  237 mL  Oral BID BM   furosemide  40 mg Intravenous BID   isosorbide mononitrate  60 mg Oral Daily   pravastatin  40 mg Oral QPM   thiamine  100 mg Oral Daily    Continuous Infusions:  heparin 1,900 Units/hr (02/21/24 0628)    PRN Meds: acetaminophen **OR** acetaminophen, ondansetron **OR** ondansetron (ZOFRAN) IV   Labs   Results for orders placed or performed during the hospital encounter of 02/19/24 (from the past 48 hours)  Glucose, capillary     Status: None   Collection Time: 02/19/24  4:24 PM  Result Value Ref Range   Glucose-Capillary 90 70 - 99 mg/dL    Comment: Glucose reference range applies only to samples taken after fasting for at least 8 hours.  CBC     Status: Abnormal   Collection Time: 02/19/24  5:32 PM  Result Value Ref Range   WBC 5.4 4.0 - 10.5 K/uL   RBC 3.65 (L) 4.22 - 5.81 MIL/uL   Hemoglobin 11.0 (L) 13.0 - 17.0 g/dL   HCT 21.3 (L) 08.6 - 57.8 %   MCV 95.6 80.0 - 100.0 fL   MCH 30.1 26.0 - 34.0 pg   MCHC 31.5 30.0 - 36.0 g/dL   RDW 46.9 62.9 - 52.8 %   Platelets 249 150 - 400 K/uL   nRBC 0.0 0.0 - 0.2 %    Comment: Performed at [facility], [address], [place], [place]  [address]  Comprehensive metabolic panel     Status: Abnormal   Collection Time: 02/19/24  5:32 PM  Result Value Ref Range   Sodium 140 135 - 145 mmol/L   Potassium 4.1 3.5 - 5.1 mmol/L   Chloride 105 98 - 111 mmol/L   CO2 27 22 - 32 mmol/L   Glucose, Bld 108 (H) 70 - 99 mg/dL    Comment: Glucose reference range applies only to samples taken after fasting for at least 8 hours.   BUN 13 6 - 20 mg/dL   Creatinine, Ser 6.04 (H) 0.61 - 1.24 mg/dL   Calcium 8.7 (L) 8.9 - 10.3 mg/dL   Total Protein 6.4 (L) 6.5 - 8.1 g/dL   Albumin 2.9 (L) 3.5 - 5.0 g/dL   AST 21 15 - 41 U/L   ALT 19 0 - 44 U/L   Alkaline Phosphatase 50 38 - 126 U/L   Total Bilirubin 1.6 (H) 0.0 - 1.2 mg/dL   GFR, Estimated >54 >09 mL/min    Comment: (NOTE) Calculated using the CKD-EPI Creatinine Equation  (2021)    Anion gap 8 5 - 15    Comment: Performed at [facility], [address], [place], [place] [address]  Troponin I (High Sensitivity)     Status: Abnormal   Collection Time: 02/19/24  5:32 PM  Result Value Ref Range   Troponin I (High Sensitivity) 3,420 (HH) <18 ng/L    Comment: CRITICAL RESULT CALLED TO, READ BACK BY AND VERIFIED WITH SHANMEKA  ARMSTRONG,RN @1835  02/19/2024 VANG.J (NOTE) Elevated high sensitivity troponin I (hsTnI) values and significant  changes across serial measurements may suggest ACS but many other  chronic and acute conditions are known to elevate hsTnI results.  Refer to the "Links" section for chest pain algorithms and additional  guidance. Performed at [facility], [address], [place], [place] [address]   Protime-INR     Status: None   Collection Time: 02/19/24  5:32 PM  Result Value Ref Range   Prothrombin Time 14.4 11.4 - 15.2 seconds   INR 1.1 0.8 - 1.2    Comment: (NOTE) INR goal varies based on device and disease states. Performed at [facility], [address] [place], [place] [address]   Heparin level (unfractionated)     Status: Abnormal   Collection Time: 02/19/24  5:32 PM  Result Value Ref Range   Heparin Unfractionated 0.17 (L) 0.30 - 0.70 IU/mL    Comment: (NOTE) The clinical reportable range upper limit is being lowered to >1.10 to align with the FDA approved guidance for the current laboratory assay.  If heparin results are below expected values, and patient dosage has  been confirmed, suggest follow up testing of antithrombin III levels. Performed at [facility], [address] [place], [place] [address]   HIV Antibody (routine testing w rflx)     Status: None   Collection Time: 02/19/24  5:56 PM  Result Value Ref Range   HIV Screen 4th Generation wRfx Non Reactive Non Reactive    Comment: Performed at [facility], [address], [place], [place] [address]  Heparin level  (unfractionated)     Status: Abnormal   Collection Time: 02/20/24 12:39 AM  Result Value Ref Range   Heparin Unfractionated 0.25 (L) 0.30 - 0.70 IU/mL    Comment: (NOTE) The clinical reportable range upper limit is being lowered to >1.10 to align with the FDA approved guidance for  the current laboratory assay.  If heparin results are below expected values, and patient dosage has  been confirmed, suggest follow up testing of antithrombin III levels. Performed at [facility], [address], [place], [place] [address]   CBC     Status: Abnormal   Collection Time: 02/20/24  1:47 AM  Result Value Ref Range   WBC 6.2 4.0 - 10.5 K/uL   RBC 3.58 (L) 4.22 - 5.81 MIL/uL   Hemoglobin 10.9 (L) 13.0 - 17.0 g/dL   HCT 60.4 (L) 54.0 - 98.1 %   MCV 95.3 80.0 - 100.0 fL   MCH 30.4 26.0 - 34.0 pg   MCHC 32.0 30.0 - 36.0 g/dL   RDW 19.1 47.8 - 29.5 %   Platelets 266 150 - 400 K/uL   nRBC 0.0 0.0 - 0.2 %    Comment: Performed at [facility], [address], [place], [place] [address]  Basic metabolic panel     Status: Abnormal   Collection Time: 02/20/24  1:47 AM  Result Value Ref Range   Sodium 139 135 - 145 mmol/L   Potassium 3.3 (L) 3.5 - 5.1 mmol/L   Chloride 102 98 - 111 mmol/L   CO2 27 22 - 32 mmol/L   Glucose, Bld 100 (H) 70 - 99 mg/dL    Comment: Glucose reference range applies only to samples taken after fasting for at least 8 hours.   BUN 12 6 - 20 mg/dL   Creatinine, Ser 8.65 (H) 0.61 - 1.24 mg/dL   Calcium 8.5 (L) 8.9 - 10.3 mg/dL   GFR, Estimated 54 (L) >60 mL/min    Comment: (NOTE) Calculated using the CKD-EPI Creatinine Equation (2021)    Anion gap 10 5 - 15    Comment: Performed at [facility], [address], [place], [place] [address]  Heparin level (unfractionated)     Status: None   Collection Time: 02/20/24  7:55 AM  Result Value Ref Range   Heparin Unfractionated 0.48 0.30 - 0.70 IU/mL    Comment: (NOTE) The clinical reportable range upper  limit is being lowered to >1.10 to align with the FDA approved guidance for the current laboratory assay.  If heparin results are below expected values, and patient dosage has  been confirmed, suggest follow up testing of antithrombin III levels. Performed at [facility], [address], [place], [place] [address]   Heparin level (unfractionated)     Status: None   Collection Time: 02/20/24  3:07 PM  Result Value Ref Range   Heparin Unfractionated 0.55 0.30 - 0.70 IU/mL    Comment: (NOTE) The clinical reportable range upper limit is being lowered to >1.10 to align with the FDA approved guidance for the current laboratory assay.  If heparin results are below expected values, and patient dosage has  been confirmed, suggest follow up testing of antithrombin III levels. Performed at [facility], [address], [place], [place] [address]   Heparin level (unfractionated)     Status: None   Collection Time: 02/21/24  2:36 AM  Result Value Ref Range   Heparin Unfractionated 0.67 0.30 - 0.70 IU/mL    Comment: (NOTE) The clinical reportable range upper limit is being lowered to >1.10 to align with the FDA approved guidance for the current laboratory assay.  If heparin results are below expected values, and patient dosage has  been confirmed, suggest follow up testing of antithrombin III levels. Performed at  [facility], [address] [place], [place] [address]   CBC     Status: Abnormal   Collection Time: 02/21/24  2:36 AM  Result Value Ref Range   WBC 6.1 4.0 - 10.5 K/uL   RBC 3.57 (L) 4.22 - 5.81 MIL/uL   Hemoglobin 10.7 (L) 13.0 - 17.0 g/dL   HCT 78.2 (L) 95.6 - 21.3 %   MCV 95.8 80.0 - 100.0 fL   MCH 30.0 26.0 - 34.0 pg   MCHC 31.3 30.0 - 36.0 g/dL   RDW 08.6 57.8 - 46.9 %   Platelets 262 150 - 400 K/uL   nRBC 0.0 0.0 - 0.2 %    Comment: Performed at [facility], [address], [place], [place] [address]  Basic metabolic panel     Status:  Abnormal   Collection Time: 02/21/24  2:36 AM  Result Value Ref Range   Sodium 140 135 - 145 mmol/L   Potassium 3.9 3.5 - 5.1 mmol/L   Chloride 103 98 - 111 mmol/L   CO2 26 22 - 32 mmol/L   Glucose, Bld 125 (H) 70 - 99 mg/dL    Comment: Glucose reference range applies only to samples taken after fasting for at least 8 hours.   BUN 17 6 - 20 mg/dL   Creatinine, Ser 8.41 (H) 0.61 - 1.24 mg/dL   Calcium 9.5 8.9 - 32.4 mg/dL   GFR, Estimated >40 >10 mL/min    Comment: (NOTE) Calculated using the CKD-EPI Creatinine Equation (2021)    Anion gap 11 5 - 15    Comment: Performed at [facility], [address], [place], [place] [address]    ECG   N/A  Telemetry   Normal sinus rhythm - Personally Reviewed  Radiology    ECHOCARDIOGRAM COMPLETE Result Date: 02/20/2024    ECHOCARDIOGRAM REPORT   Patient Name:   Isaac Wilkerson Date of Exam: 02/20/2024 Medical Rec #:  [id number]      Height:       66.0 in Accession #:    [id number]     Weight:       168.0 lb Date of Birth:  1966/04/01       BSA:          1.857 m Patient Age:    57 years       BP:           141/94 mmHg Patient Gender: M              HR:           9 bpm. Exam Location:  Inpatient Procedure: 2D Echo, Cardiac Doppler, Color Doppler and Intracardiac            Opacification Agent (Both Spectral and Color Flow Doppler were            utilized during procedure). Indications:    CHF- Acute Diastolic I50.31  History:        Patient has prior history of Echocardiogram examinations, most                 recent 10/08/2018. CHF, Previous Myocardial Infarction; Risk                 Factors:Hypertension and Current Smoker.  Sonographer:    Lucendia Herrlich RCS Referring Phys: [id number] PREETHA JOSEPH IMPRESSIONS  1. No apical thrombus with Definity contrast. Left ventricular ejection fraction, by estimation, is 30 to 35%. Left ventricular ejection fraction  by 2D MOD biplane is 31.1 %. The left ventricle has moderately decreased function. The left  ventricle demonstrates global hypokinesis. There is mild left ventricular hypertrophy. Left ventricular diastolic parameters are consistent with Grade III diastolic dysfunction (restrictive). Elevated left ventricular end-diastolic pressure. The E/e' is 24.  2. Right ventricular systolic function is moderately reduced. The right ventricular size is moderately enlarged. There is severely elevated pulmonary artery systolic pressure. The estimated right ventricular systolic pressure is 67.1 mmHg.  3. Left atrial size was severely dilated.  4. Right atrial size was severely dilated.  5. The mitral valve is abnormal. Moderate to severe mitral valve regurgitation.  6. The aortic valve is tricuspid. Aortic valve regurgitation is not visualized.  7. The inferior vena cava is dilated in size with <50% respiratory variability, suggesting right atrial pressure of 15 mmHg. Comparison(s): Prior images unable to be directly viewed, comparison made by report only. Changes from prior study are noted. 10/08/2018: LVEF 55-60%. FINDINGS  Left Ventricle: No apical thrombus with Definity contrast. Left ventricular ejection fraction, by estimation, is 30 to 35%. Left ventricular ejection fraction by 2D MOD biplane is 31.1 %. The left ventricle has moderately decreased function. The left ventricle demonstrates global hypokinesis. Definity contrast agent was given IV to delineate the left ventricular endocardial borders. Strain imaging was not performed. The left ventricular internal cavity size was normal in size. There is mild left ventricular hypertrophy. Left ventricular diastolic parameters are consistent with Grade III diastolic dysfunction (restrictive). Elevated left ventricular end-diastolic pressure. The E/e' is 24. Right Ventricle: The right ventricular size is moderately enlarged. No increase in right ventricular wall thickness. Right ventricular systolic function is moderately reduced. There is severely elevated pulmonary  artery systolic pressure. The tricuspid regurgitant velocity is 3.61 m/s, and with an assumed right atrial pressure of 15 mmHg, the estimated right ventricular systolic pressure is 67.1 mmHg. Left Atrium: Left atrial size was severely dilated. Right Atrium: Right atrial size was severely dilated. Pericardium: There is no evidence of pericardial effusion. Mitral Valve: The mitral valve is abnormal. There is mild calcification of the anterior and posterior mitral valve leaflet(s). Moderate to severe mitral valve regurgitation, with eccentric laterally directed jet. Tricuspid Valve: The tricuspid valve is grossly normal. Tricuspid valve regurgitation is mild. Aortic Valve: The aortic valve is tricuspid. Aortic valve regurgitation is not visualized. Aortic valve peak gradient measures 5.3 mmHg. Pulmonic Valve: The pulmonic valve was grossly normal. Pulmonic valve regurgitation is trivial. Aorta: The aortic root and ascending aorta are structurally normal, with no evidence of dilitation. Venous: The inferior vena cava is dilated in size with less than 50% respiratory variability, suggesting right atrial pressure of 15 mmHg. IAS/Shunts: No atrial level shunt detected by color flow Doppler. Additional Comments: 3D imaging was not performed.  LEFT VENTRICLE PLAX 2D                        Biplane EF (MOD) LVIDd:         5.90 cm         LV Biplane EF:   Left LVIDs:         5.00 cm                          ventricular LV PW:         1.20 cm  ejection LV IVS:        1.20 cm                          fraction by LVOT diam:     2.20 cm                          2D MOD LV SV:         59                               biplane is LV SV Index:   32                               31.1 %. LVOT Area:     3.80 cm                                Diastology                                LV e' medial:    4.90 cm/s LV Volumes (MOD)               LV E/e' medial:  24.1 LV vol d, MOD    204.0 ml      LV e' lateral:   10.00  cm/s A2C:                           LV E/e' lateral: 11.8 LV vol d, MOD    263.0 ml A4C: LV vol s, MOD    154.0 ml A2C: LV vol s, MOD    170.0 ml A4C: LV SV MOD A2C:   50.0 ml LV SV MOD A4C:   263.0 ml LV SV MOD BP:    74.6 ml RIGHT VENTRICLE            IVC RV S prime:     6.96 cm/s  IVC diam: 2.30 cm TAPSE (M-mode): 1.3 cm LEFT ATRIUM             Index        RIGHT ATRIUM           Index LA diam:        4.90 cm 2.64 cm/m   RA Area:     24.60 cm LA Vol (A2C):   81.4 ml 43.84 ml/m  RA Volume:   92.50 ml  49.81 ml/m LA Vol (A4C):   87.4 ml 47.07 ml/m LA Biplane Vol: 88.9 ml 47.87 ml/m  AORTIC VALVE AV Area (Vmax): 3.50 cm AV Vmax:        115.00 cm/s AV Peak Grad:   5.3 mmHg LVOT Vmax:      106.00 cm/s LVOT Vmean:     66.067 cm/s LVOT VTI:       0.154 m  AORTA Ao Root diam: 3.20 cm Ao Asc diam:  3.60 cm MITRAL VALVE                TRICUSPID VALVE MV Area (PHT): 6.65 cm     TR Peak grad:   52.1 mmHg MV Decel Time: 114 msec     TR  Vmax:        361.00 cm/s MR Peak grad: 93.5 mmHg MR Vmax:      483.50 cm/s   SHUNTS MV E velocity: 118.00 cm/s  Systemic VTI:  0.15 m MV A velocity: 56.80 cm/s   Systemic Diam: 2.20 cm MV E/A ratio:  2.08 Zoila Shutter MD Electronically signed by Zoila Shutter MD Signature Date/Time: 02/20/2024/9:28:24 AM    Final     Cardiac Studies   See echo above  Assessment   Principal Problem:   CHF (congestive heart failure) (HCC) Active Problems:   NSTEMI (non-ST elevated myocardial infarction) (HCC)   Essential hypertension   Chronic combined systolic (congestive) and diastolic (congestive) heart failure (HCC)   Tobacco abuse   Polysubstance abuse (HCC)   Alcohol abuse   Plan   NSTEMI CAD Elevated troponin at [facility]: 10,777 > 11,329 > 9,186 > 8,104 > 5,846 Awaiting updated troponin at our facility  Coronary CTA (2019) showed moderate RCA stenosis, deemed not significant disease by FFR Patient reports medication non-compliance and frequent cocaine, marijuana and  alcohol use  Continue IV heparin  Continue ASA 81 mg daily Continue pravastatin 40 mg daily Continue Imdur 60 mg daily  Plan for [facility] probably Monday.   Acute on chronic combined CHF Echo from 03/2016 showed EF 25-30%, EF then recovered to 55-60% in 09/2018 Echo from 09/2018 also showed: grade 1 diastolic dysfunction, apical hypokinesis, mild LVH He reports medication noncompliance because he states he is going through hard times in his personal life Mild JVD and decreased sounds at lung bases on exam, no LE edema LVEF now 30-35%, global hypokinesis with grade 3 DD - not much improvement with 40 mg IV BID lasix, weight stable.  Increase IV Lasix to 80 mg BID - give additional 40 mg IV x 1 today to equal 80 mg this am Strict I/Os, daily weights, daily BMPs Creatinine improved to 1.33 Slowly initiate and titrate GDMT pending results of echo and labs  AVOID beta-blockers due to frequent cocaine abuse  Plan R/LHC on Monday based on renal function   Hypertension  Most recent BP 159/119 Patient reports pretty significant noncompliance with his medications Patient also reports frequent polysubstance abuse including alcohol, tobacco, marijuana, cocaine Continue amlodipine 10 mg daily  Continue Imdur 60 mg daily  Holding lisinopril 2/2 AKI  AVOID beta-blockers due to frequent cocaine abuse    Per primary Tobacco abuse Polysubstance abuse Alcohol abuse, possibly requiring CIWA AKI Suspected COPD  Informed Consent   Shared Decision Making/Informed Consent The risks [stroke (1 in 1000), death (1 in 1000), kidney failure [usually temporary] (1 in 500), bleeding (1 in 200), allergic reaction [possibly serious] (1 in 200)], benefits (diagnostic support and management of coronary artery disease) and alternatives of a cardiac catheterization were discussed in detail with Mr. Farruggia and he is willing to proceed.    Time Spent Directly with Patient:  I have spent a total of 25 minutes with  the patient reviewing hospital notes, telemetry, EKGs, labs and examining the patient as well as establishing an assessment and plan that was discussed personally with the patient.  > 50% of time was spent in direct patient care.  Length of Stay:  LOS: 2 days   Chrystie Nose, MD, [facility], FACP  [place]  [facility]  Medical Director of the Advanced Lipid Disorders &  Cardiovascular Risk Reduction Clinic Diplomate of the American Board of Clinical Lipidology Attending Cardiologist  Direct Dial: [phone]  Fax: [phone]  Website:  [email or web address] Nicely Aiva Miskell 02/21/2024, 8:50 AM

## 2024-02-21 NOTE — Progress Notes (Addendum)
 DAILY PROGRESS NOTE   Patient Name: Isaac Wilkerson Date of Encounter: 02/21/2024 Cardiologist: Dina Rich, MD  Chief Complaint   Breathing has improved  Patient Profile   Isaac Wilkerson is a 58 y.o. male with a hx of hypertension, chronic combined CHF, history of NSTEMI 09/2018, tobacco use, polysubstance abuse who is being seen 02/19/2024 for the evaluation of NSTEMI, acute on chronic CHF at the request of Dr. Jomarie Longs.   Subjective   Minimally net negative with diuresis overnight- creatinine improved at 1.33. Echo yesterday (personally read) shows LVEF 30-35%, global hypokinesis and restrictive filling pressures and severe pulmonary hypertension. Weight unchanged, around 76 kg.   Objective   Vitals:   02/20/24 2315 02/21/24 0443 02/21/24 0450 02/21/24 0741  BP: (!) 141/89  133/89 (!) 153/106  Pulse: 87  89 90  Resp: 17  18 20   Temp: 98.4 F (36.9 C)  98.1 F (36.7 C) 97.8 F (36.6 C)  TempSrc: Oral  Oral Oral  SpO2: 93%  100% 98%  Weight:  76.6 kg    Height:        Intake/Output Summary (Last 24 hours) at 02/21/2024 0850 Last data filed at 02/21/2024 2956 Gross per 24 hour  Intake 1412.42 ml  Output 1675 ml  Net -262.58 ml   Filed Weights   02/19/24 1654 02/20/24 0420 02/21/24 0443  Weight: 77.2 kg 76.2 kg 76.6 kg    Physical Exam   General appearance: alert and no distress Neck: JVD - several cm above sternal notch and thyroid not enlarged, symmetric, no tenderness/mass/nodules Lungs: diminished breath sounds bibasilar Heart: regular rate and rhythm Abdomen: soft, non-tender; bowel sounds normal; no masses,  no organomegaly Extremities: extremities normal, atraumatic, no cyanosis or edema Pulses: 2+ and symmetric Skin: Skin color, texture, turgor normal. No rashes or lesions Neurologic: Grossly normal Psych: Pleasant  Inpatient Medications    Scheduled Meds:  amLODipine  10 mg Oral Daily   aspirin EC  81 mg Oral Daily   feeding supplement  237 mL  Oral BID BM   furosemide  40 mg Intravenous BID   isosorbide mononitrate  60 mg Oral Daily   pravastatin  40 mg Oral QPM   thiamine  100 mg Oral Daily    Continuous Infusions:  heparin 1,900 Units/hr (02/21/24 0628)    PRN Meds: acetaminophen **OR** acetaminophen, ondansetron **OR** ondansetron (ZOFRAN) IV   Labs   Results for orders placed or performed during the hospital encounter of 02/19/24 (from the past 48 hours)  Glucose, capillary     Status: None   Collection Time: 02/19/24  4:24 PM  Result Value Ref Range   Glucose-Capillary 90 70 - 99 mg/dL    Comment: Glucose reference range applies only to samples taken after fasting for at least 8 hours.  CBC     Status: Abnormal   Collection Time: 02/19/24  5:32 PM  Result Value Ref Range   WBC 5.4 4.0 - 10.5 K/uL   RBC 3.65 (L) 4.22 - 5.81 MIL/uL   Hemoglobin 11.0 (L) 13.0 - 17.0 g/dL   HCT 21.3 (L) 08.6 - 57.8 %   MCV 95.6 80.0 - 100.0 fL   MCH 30.1 26.0 - 34.0 pg   MCHC 31.5 30.0 - 36.0 g/dL   RDW 46.9 62.9 - 52.8 %   Platelets 249 150 - 400 K/uL   nRBC 0.0 0.0 - 0.2 %    Comment: Performed at [facility], [address], [place], [place]  [address]  Comprehensive metabolic panel     Status: Abnormal   Collection Time: 02/19/24  5:32 PM  Result Value Ref Range   Sodium 140 135 - 145 mmol/L   Potassium 4.1 3.5 - 5.1 mmol/L   Chloride 105 98 - 111 mmol/L   CO2 27 22 - 32 mmol/L   Glucose, Bld 108 (H) 70 - 99 mg/dL    Comment: Glucose reference range applies only to samples taken after fasting for at least 8 hours.   BUN 13 6 - 20 mg/dL   Creatinine, Ser 6.04 (H) 0.61 - 1.24 mg/dL   Calcium 8.7 (L) 8.9 - 10.3 mg/dL   Total Protein 6.4 (L) 6.5 - 8.1 g/dL   Albumin 2.9 (L) 3.5 - 5.0 g/dL   AST 21 15 - 41 U/L   ALT 19 0 - 44 U/L   Alkaline Phosphatase 50 38 - 126 U/L   Total Bilirubin 1.6 (H) 0.0 - 1.2 mg/dL   GFR, Estimated >54 >09 mL/min    Comment: (NOTE) Calculated using the CKD-EPI Creatinine Equation  (2021)    Anion gap 8 5 - 15    Comment: Performed at [facility], [address], [place], [place] [address]  Troponin I (High Sensitivity)     Status: Abnormal   Collection Time: 02/19/24  5:32 PM  Result Value Ref Range   Troponin I (High Sensitivity) 3,420 (HH) <18 ng/L    Comment: CRITICAL RESULT CALLED TO, READ BACK BY AND VERIFIED WITH SHANMEKA  ARMSTRONG,RN @1835  02/19/2024 VANG.J (NOTE) Elevated high sensitivity troponin I (hsTnI) values and significant  changes across serial measurements may suggest ACS but many other  chronic and acute conditions are known to elevate hsTnI results.  Refer to the "Links" section for chest pain algorithms and additional  guidance. Performed at [facility], [address], [place], [place] [address]   Protime-INR     Status: None   Collection Time: 02/19/24  5:32 PM  Result Value Ref Range   Prothrombin Time 14.4 11.4 - 15.2 seconds   INR 1.1 0.8 - 1.2    Comment: (NOTE) INR goal varies based on device and disease states. Performed at [facility], [address] [place], [place] [address]   Heparin level (unfractionated)     Status: Abnormal   Collection Time: 02/19/24  5:32 PM  Result Value Ref Range   Heparin Unfractionated 0.17 (L) 0.30 - 0.70 IU/mL    Comment: (NOTE) The clinical reportable range upper limit is being lowered to >1.10 to align with the FDA approved guidance for the current laboratory assay.  If heparin results are below expected values, and patient dosage has  been confirmed, suggest follow up testing of antithrombin III levels. Performed at [facility], [address] [place], [place] [address]   HIV Antibody (routine testing w rflx)     Status: None   Collection Time: 02/19/24  5:56 PM  Result Value Ref Range   HIV Screen 4th Generation wRfx Non Reactive Non Reactive    Comment: Performed at [facility], [address], [place], [place] [address]  Heparin level  (unfractionated)     Status: Abnormal   Collection Time: 02/20/24 12:39 AM  Result Value Ref Range   Heparin Unfractionated 0.25 (L) 0.30 - 0.70 IU/mL    Comment: (NOTE) The clinical reportable range upper limit is being lowered to >1.10 to align with the FDA approved guidance for  the current laboratory assay.  If heparin results are below expected values, and patient dosage has  been confirmed, suggest follow up testing of antithrombin III levels. Performed at [facility], [address], [place], [place] [address]   CBC     Status: Abnormal   Collection Time: 02/20/24  1:47 AM  Result Value Ref Range   WBC 6.2 4.0 - 10.5 K/uL   RBC 3.58 (L) 4.22 - 5.81 MIL/uL   Hemoglobin 10.9 (L) 13.0 - 17.0 g/dL   HCT 60.4 (L) 54.0 - 98.1 %   MCV 95.3 80.0 - 100.0 fL   MCH 30.4 26.0 - 34.0 pg   MCHC 32.0 30.0 - 36.0 g/dL   RDW 19.1 47.8 - 29.5 %   Platelets 266 150 - 400 K/uL   nRBC 0.0 0.0 - 0.2 %    Comment: Performed at [facility], [address], [place], [place] [address]  Basic metabolic panel     Status: Abnormal   Collection Time: 02/20/24  1:47 AM  Result Value Ref Range   Sodium 139 135 - 145 mmol/L   Potassium 3.3 (L) 3.5 - 5.1 mmol/L   Chloride 102 98 - 111 mmol/L   CO2 27 22 - 32 mmol/L   Glucose, Bld 100 (H) 70 - 99 mg/dL    Comment: Glucose reference range applies only to samples taken after fasting for at least 8 hours.   BUN 12 6 - 20 mg/dL   Creatinine, Ser 8.65 (H) 0.61 - 1.24 mg/dL   Calcium 8.5 (L) 8.9 - 10.3 mg/dL   GFR, Estimated 54 (L) >60 mL/min    Comment: (NOTE) Calculated using the CKD-EPI Creatinine Equation (2021)    Anion gap 10 5 - 15    Comment: Performed at [facility], [address], [place], [place] [address]  Heparin level (unfractionated)     Status: None   Collection Time: 02/20/24  7:55 AM  Result Value Ref Range   Heparin Unfractionated 0.48 0.30 - 0.70 IU/mL    Comment: (NOTE) The clinical reportable range upper  limit is being lowered to >1.10 to align with the FDA approved guidance for the current laboratory assay.  If heparin results are below expected values, and patient dosage has  been confirmed, suggest follow up testing of antithrombin III levels. Performed at [facility], [address], [place], [place] [address]   Heparin level (unfractionated)     Status: None   Collection Time: 02/20/24  3:07 PM  Result Value Ref Range   Heparin Unfractionated 0.55 0.30 - 0.70 IU/mL    Comment: (NOTE) The clinical reportable range upper limit is being lowered to >1.10 to align with the FDA approved guidance for the current laboratory assay.  If heparin results are below expected values, and patient dosage has  been confirmed, suggest follow up testing of antithrombin III levels. Performed at [facility], [address], [place], [place] [address]   Heparin level (unfractionated)     Status: None   Collection Time: 02/21/24  2:36 AM  Result Value Ref Range   Heparin Unfractionated 0.67 0.30 - 0.70 IU/mL    Comment: (NOTE) The clinical reportable range upper limit is being lowered to >1.10 to align with the FDA approved guidance for the current laboratory assay.  If heparin results are below expected values, and patient dosage has  been confirmed, suggest follow up testing of antithrombin III levels. Performed at  [facility], [address] [place], [place] [address]   CBC     Status: Abnormal   Collection Time: 02/21/24  2:36 AM  Result Value Ref Range   WBC 6.1 4.0 - 10.5 K/uL   RBC 3.57 (L) 4.22 - 5.81 MIL/uL   Hemoglobin 10.7 (L) 13.0 - 17.0 g/dL   HCT 78.2 (L) 95.6 - 21.3 %   MCV 95.8 80.0 - 100.0 fL   MCH 30.0 26.0 - 34.0 pg   MCHC 31.3 30.0 - 36.0 g/dL   RDW 08.6 57.8 - 46.9 %   Platelets 262 150 - 400 K/uL   nRBC 0.0 0.0 - 0.2 %    Comment: Performed at [facility], [address], [place], [place] [address]  Basic metabolic panel     Status:  Abnormal   Collection Time: 02/21/24  2:36 AM  Result Value Ref Range   Sodium 140 135 - 145 mmol/L   Potassium 3.9 3.5 - 5.1 mmol/L   Chloride 103 98 - 111 mmol/L   CO2 26 22 - 32 mmol/L   Glucose, Bld 125 (H) 70 - 99 mg/dL    Comment: Glucose reference range applies only to samples taken after fasting for at least 8 hours.   BUN 17 6 - 20 mg/dL   Creatinine, Ser 8.41 (H) 0.61 - 1.24 mg/dL   Calcium 9.5 8.9 - 32.4 mg/dL   GFR, Estimated >40 >10 mL/min    Comment: (NOTE) Calculated using the CKD-EPI Creatinine Equation (2021)    Anion gap 11 5 - 15    Comment: Performed at [facility], [address], [place], [place] [address]    ECG   N/A  Telemetry   Normal sinus rhythm - Personally Reviewed  Radiology    ECHOCARDIOGRAM COMPLETE Result Date: 02/20/2024    ECHOCARDIOGRAM REPORT   Patient Name:   DONYAE KILNER Date of Exam: 02/20/2024 Medical Rec #:  [id number]      Height:       66.0 in Accession #:    [id number]     Weight:       168.0 lb Date of Birth:  1966/04/01       BSA:          1.857 m Patient Age:    57 years       BP:           141/94 mmHg Patient Gender: M              HR:           9 bpm. Exam Location:  Inpatient Procedure: 2D Echo, Cardiac Doppler, Color Doppler and Intracardiac            Opacification Agent (Both Spectral and Color Flow Doppler were            utilized during procedure). Indications:    CHF- Acute Diastolic I50.31  History:        Patient has prior history of Echocardiogram examinations, most                 recent 10/08/2018. CHF, Previous Myocardial Infarction; Risk                 Factors:Hypertension and Current Smoker.  Sonographer:    Lucendia Herrlich RCS Referring Phys: [id number] PREETHA JOSEPH IMPRESSIONS  1. No apical thrombus with Definity contrast. Left ventricular ejection fraction, by estimation, is 30 to 35%. Left ventricular ejection fraction  by 2D MOD biplane is 31.1 %. The left ventricle has moderately decreased function. The left  ventricle demonstrates global hypokinesis. There is mild left ventricular hypertrophy. Left ventricular diastolic parameters are consistent with Grade III diastolic dysfunction (restrictive). Elevated left ventricular end-diastolic pressure. The E/e' is 24.  2. Right ventricular systolic function is moderately reduced. The right ventricular size is moderately enlarged. There is severely elevated pulmonary artery systolic pressure. The estimated right ventricular systolic pressure is 67.1 mmHg.  3. Left atrial size was severely dilated.  4. Right atrial size was severely dilated.  5. The mitral valve is abnormal. Moderate to severe mitral valve regurgitation.  6. The aortic valve is tricuspid. Aortic valve regurgitation is not visualized.  7. The inferior vena cava is dilated in size with <50% respiratory variability, suggesting right atrial pressure of 15 mmHg. Comparison(s): Prior images unable to be directly viewed, comparison made by report only. Changes from prior study are noted. 10/08/2018: LVEF 55-60%. FINDINGS  Left Ventricle: No apical thrombus with Definity contrast. Left ventricular ejection fraction, by estimation, is 30 to 35%. Left ventricular ejection fraction by 2D MOD biplane is 31.1 %. The left ventricle has moderately decreased function. The left ventricle demonstrates global hypokinesis. Definity contrast agent was given IV to delineate the left ventricular endocardial borders. Strain imaging was not performed. The left ventricular internal cavity size was normal in size. There is mild left ventricular hypertrophy. Left ventricular diastolic parameters are consistent with Grade III diastolic dysfunction (restrictive). Elevated left ventricular end-diastolic pressure. The E/e' is 24. Right Ventricle: The right ventricular size is moderately enlarged. No increase in right ventricular wall thickness. Right ventricular systolic function is moderately reduced. There is severely elevated pulmonary  artery systolic pressure. The tricuspid regurgitant velocity is 3.61 m/s, and with an assumed right atrial pressure of 15 mmHg, the estimated right ventricular systolic pressure is 67.1 mmHg. Left Atrium: Left atrial size was severely dilated. Right Atrium: Right atrial size was severely dilated. Pericardium: There is no evidence of pericardial effusion. Mitral Valve: The mitral valve is abnormal. There is mild calcification of the anterior and posterior mitral valve leaflet(s). Moderate to severe mitral valve regurgitation, with eccentric laterally directed jet. Tricuspid Valve: The tricuspid valve is grossly normal. Tricuspid valve regurgitation is mild. Aortic Valve: The aortic valve is tricuspid. Aortic valve regurgitation is not visualized. Aortic valve peak gradient measures 5.3 mmHg. Pulmonic Valve: The pulmonic valve was grossly normal. Pulmonic valve regurgitation is trivial. Aorta: The aortic root and ascending aorta are structurally normal, with no evidence of dilitation. Venous: The inferior vena cava is dilated in size with less than 50% respiratory variability, suggesting right atrial pressure of 15 mmHg. IAS/Shunts: No atrial level shunt detected by color flow Doppler. Additional Comments: 3D imaging was not performed.  LEFT VENTRICLE PLAX 2D                        Biplane EF (MOD) LVIDd:         5.90 cm         LV Biplane EF:   Left LVIDs:         5.00 cm                          ventricular LV PW:         1.20 cm  ejection LV IVS:        1.20 cm                          fraction by LVOT diam:     2.20 cm                          2D MOD LV SV:         59                               biplane is LV SV Index:   32                               31.1 %. LVOT Area:     3.80 cm                                Diastology                                LV e' medial:    4.90 cm/s LV Volumes (MOD)               LV E/e' medial:  24.1 LV vol d, MOD    204.0 ml      LV e' lateral:   10.00  cm/s A2C:                           LV E/e' lateral: 11.8 LV vol d, MOD    263.0 ml A4C: LV vol s, MOD    154.0 ml A2C: LV vol s, MOD    170.0 ml A4C: LV SV MOD A2C:   50.0 ml LV SV MOD A4C:   263.0 ml LV SV MOD BP:    74.6 ml RIGHT VENTRICLE            IVC RV S prime:     6.96 cm/s  IVC diam: 2.30 cm TAPSE (M-mode): 1.3 cm LEFT ATRIUM             Index        RIGHT ATRIUM           Index LA diam:        4.90 cm 2.64 cm/m   RA Area:     24.60 cm LA Vol (A2C):   81.4 ml 43.84 ml/m  RA Volume:   92.50 ml  49.81 ml/m LA Vol (A4C):   87.4 ml 47.07 ml/m LA Biplane Vol: 88.9 ml 47.87 ml/m  AORTIC VALVE AV Area (Vmax): 3.50 cm AV Vmax:        115.00 cm/s AV Peak Grad:   5.3 mmHg LVOT Vmax:      106.00 cm/s LVOT Vmean:     66.067 cm/s LVOT VTI:       0.154 m  AORTA Ao Root diam: 3.20 cm Ao Asc diam:  3.60 cm MITRAL VALVE                TRICUSPID VALVE MV Area (PHT): 6.65 cm     TR Peak grad:   52.1 mmHg MV Decel Time: 114 msec     TR  Vmax:        361.00 cm/s MR Peak grad: 93.5 mmHg MR Vmax:      483.50 cm/s   SHUNTS MV E velocity: 118.00 cm/s  Systemic VTI:  0.15 m MV A velocity: 56.80 cm/s   Systemic Diam: 2.20 cm MV E/A ratio:  2.08 Zoila Shutter MD Electronically signed by Zoila Shutter MD Signature Date/Time: 02/20/2024/9:28:24 AM    Final     Cardiac Studies   See echo above  Assessment   Principal Problem:   CHF (congestive heart failure) (HCC) Active Problems:   NSTEMI (non-ST elevated myocardial infarction) (HCC)   Essential hypertension   Chronic combined systolic (congestive) and diastolic (congestive) heart failure (HCC)   Tobacco abuse   Polysubstance abuse (HCC)   Alcohol abuse   Plan   NSTEMI CAD Elevated troponin at [facility]: 10,777 > 11,329 > 9,186 > 8,104 > 5,846 Awaiting updated troponin at our facility  Coronary CTA (2019) showed moderate RCA stenosis, deemed not significant disease by FFR Patient reports medication non-compliance and frequent cocaine, marijuana and  alcohol use  Continue IV heparin  Continue ASA 81 mg daily Continue pravastatin 40 mg daily Continue Imdur 60 mg daily  Plan for [facility] probably Monday.   Acute on chronic combined CHF Echo from 03/2016 showed EF 25-30%, EF then recovered to 55-60% in 09/2018 Echo from 09/2018 also showed: grade 1 diastolic dysfunction, apical hypokinesis, mild LVH He reports medication noncompliance because he states he is going through hard times in his personal life Mild JVD and decreased sounds at lung bases on exam, no LE edema LVEF now 30-35%, global hypokinesis with grade 3 DD - not much improvement with 40 mg IV BID lasix, weight stable.  Increase IV Lasix to 80 mg BID - give additional 40 mg IV x 1 today to equal 80 mg this am Strict I/Os, daily weights, daily BMPs Creatinine improved to 1.33 Slowly initiate and titrate GDMT pending results of echo and labs  AVOID beta-blockers due to frequent cocaine abuse  Plan R/LHC on Monday based on renal function   Hypertension  Most recent BP 159/119 Patient reports pretty significant noncompliance with his medications Patient also reports frequent polysubstance abuse including alcohol, tobacco, marijuana, cocaine Continue amlodipine 10 mg daily  Continue Imdur 60 mg daily  Holding lisinopril 2/2 AKI  AVOID beta-blockers due to frequent cocaine abuse    Per primary Tobacco abuse Polysubstance abuse Alcohol abuse, possibly requiring CIWA AKI Suspected COPD  Informed Consent   Shared Decision Making/Informed Consent The risks [stroke (1 in 1000), death (1 in 1000), kidney failure [usually temporary] (1 in 500), bleeding (1 in 200), allergic reaction [possibly serious] (1 in 200)], benefits (diagnostic support and management of coronary artery disease) and alternatives of a cardiac catheterization were discussed in detail with Mr. Farruggia and he is willing to proceed.    Time Spent Directly with Patient:  I have spent a total of 25 minutes with  the patient reviewing hospital notes, telemetry, EKGs, labs and examining the patient as well as establishing an assessment and plan that was discussed personally with the patient.  > 50% of time was spent in direct patient care.  Length of Stay:  LOS: 2 days   Chrystie Nose, MD, [facility], FACP  [place]  [facility]  Medical Director of the [facility] Lipid Disorders &  Cardiovascular Risk Reduction Clinic Diplomate of the American Board of Clinical Lipidology Attending Cardiologist  Direct Dial: [phone]  Fax: [phone]  Website:  [email or web address] Nicely Aiva Miskell 02/21/2024, 8:50 AM

## 2024-02-22 ENCOUNTER — Other Ambulatory Visit (HOSPITAL_COMMUNITY): Payer: Self-pay

## 2024-02-22 ENCOUNTER — Telehealth (HOSPITAL_COMMUNITY): Payer: Self-pay | Admitting: Pharmacy Technician

## 2024-02-22 ENCOUNTER — Encounter (HOSPITAL_COMMUNITY): Payer: Self-pay | Admitting: Internal Medicine

## 2024-02-22 ENCOUNTER — Encounter (HOSPITAL_COMMUNITY)
Admission: EM | Disposition: A | Discharge status: Home / Self Care | Payer: Self-pay | Source: Other Acute Inpatient Hospital | Attending: Internal Medicine

## 2024-02-22 DIAGNOSIS — I2510 Atherosclerotic heart disease of native coronary artery without angina pectoris: Secondary | ICD-10-CM

## 2024-02-22 DIAGNOSIS — I5021 Acute systolic (congestive) heart failure: Secondary | ICD-10-CM | POA: Diagnosis not present

## 2024-02-22 DIAGNOSIS — I214 Non-ST elevation (NSTEMI) myocardial infarction: Secondary | ICD-10-CM | POA: Diagnosis not present

## 2024-02-22 DIAGNOSIS — F1910 Other psychoactive substance abuse, uncomplicated: Secondary | ICD-10-CM | POA: Diagnosis not present

## 2024-02-22 DIAGNOSIS — I5043 Acute on chronic combined systolic (congestive) and diastolic (congestive) heart failure: Secondary | ICD-10-CM | POA: Diagnosis not present

## 2024-02-22 HISTORY — PX: RIGHT/LEFT HEART CATH AND CORONARY ANGIOGRAPHY: CATH118266

## 2024-02-22 LAB — POCT I-STAT EG7
Acid-Base Excess: 2 mmol/L (ref 0.0–2.0)
Bicarbonate: 28.9 mmol/L — ABNORMAL HIGH (ref 20.0–28.0)
Calcium, Ion: 1.25 mmol/L (ref 1.15–1.40)
HCT: 37 % — ABNORMAL LOW (ref 39.0–52.0)
Hemoglobin: 12.6 g/dL — ABNORMAL LOW (ref 13.0–17.0)
O2 Saturation: 54 %
Potassium: 3.9 mmol/L (ref 3.5–5.1)
Sodium: 133 mmol/L — ABNORMAL LOW (ref 135–145)
TCO2: 31 mmol/L (ref 22–32)
pCO2, Ven: 55.7 mmHg (ref 44–60)
pH, Ven: 7.323 (ref 7.25–7.43)
pO2, Ven: 31 mmHg — CL (ref 32–45)

## 2024-02-22 LAB — LIPID PANEL
Cholesterol: 172 mg/dL (ref 0–200)
HDL: 52 mg/dL (ref >40)
LDL Cholesterol: 91 mg/dL (ref 0–99)
Total CHOL/HDL Ratio: 3.3 ratio
Triglycerides: 146 mg/dL (ref <150)
VLDL: 29 mg/dL (ref 0–40)

## 2024-02-22 LAB — CBC
HCT: 37.7 % — ABNORMAL LOW (ref 39.0–52.0)
Hemoglobin: 11.9 g/dL — ABNORMAL LOW (ref 13.0–17.0)
MCH: 29.8 pg (ref 26.0–34.0)
MCHC: 31.6 g/dL (ref 30.0–36.0)
MCV: 94.5 fL (ref 80.0–100.0)
Platelets: 288 10*3/uL (ref 150–400)
RBC: 3.99 MIL/uL — ABNORMAL LOW (ref 4.22–5.81)
RDW: 13.5 % (ref 11.5–15.5)
WBC: 7.4 10*3/uL (ref 4.0–10.5)
nRBC: 0 % (ref 0.0–0.2)

## 2024-02-22 LAB — POCT I-STAT 7, (LYTES, BLD GAS, ICA,H+H)
Acid-Base Excess: 4 mmol/L — ABNORMAL HIGH (ref 0.0–2.0)
Bicarbonate: 29.1 mmol/L — ABNORMAL HIGH (ref 20.0–28.0)
Calcium, Ion: 1.26 mmol/L (ref 1.15–1.40)
HCT: 37 % — ABNORMAL LOW (ref 39.0–52.0)
Hemoglobin: 12.6 g/dL — ABNORMAL LOW (ref 13.0–17.0)
O2 Saturation: 93 %
Potassium: 4.1 mmol/L (ref 3.5–5.1)
Sodium: 139 mmol/L (ref 135–145)
TCO2: 30 mmol/L (ref 22–32)
pCO2 arterial: 43.6 mmHg (ref 32–48)
pH, Arterial: 7.432 (ref 7.35–7.45)
pO2, Arterial: 66 mmHg — ABNORMAL LOW (ref 83–108)

## 2024-02-22 LAB — BASIC METABOLIC PANEL
Anion gap: 9 (ref 5–15)
BUN: 23 mg/dL — ABNORMAL HIGH (ref 6–20)
CO2: 27 mmol/L (ref 22–32)
Calcium: 9.9 mg/dL (ref 8.9–10.3)
Chloride: 102 mmol/L (ref 98–111)
Creatinine, Ser: 1.48 mg/dL — ABNORMAL HIGH (ref 0.61–1.24)
GFR, Estimated: 55 mL/min — ABNORMAL LOW (ref >60)
Glucose, Bld: 115 mg/dL — ABNORMAL HIGH (ref 70–99)
Potassium: 3.5 mmol/L (ref 3.5–5.1)
Sodium: 138 mmol/L (ref 135–145)

## 2024-02-22 LAB — HEPARIN LEVEL (UNFRACTIONATED): Heparin Unfractionated: 0.69 [IU]/mL (ref 0.30–0.70)

## 2024-02-22 LAB — HEMOGLOBIN A1C
Hgb A1c MFr Bld: 4.6 % — ABNORMAL LOW (ref 4.8–5.6)
Mean Plasma Glucose: 85.32 mg/dL

## 2024-02-22 SURGERY — RIGHT/LEFT HEART CATH AND CORONARY ANGIOGRAPHY
Anesthesia: LOCAL

## 2024-02-22 MED ORDER — LIDOCAINE HCL (PF) 1 % IJ SOLN
INTRAMUSCULAR | Status: AC
  Filled 2024-02-22: qty 30

## 2024-02-22 MED ORDER — ATORVASTATIN CALCIUM 40 MG PO TABS
40.0000 mg | ORAL_TABLET | Freq: Every day | ORAL | Status: DC
  Administered 2024-02-23: 40 mg via ORAL
  Filled 2024-02-22: qty 1

## 2024-02-22 MED ORDER — MIDAZOLAM HCL 2 MG/2ML IJ SOLN
INTRAMUSCULAR | Status: DC | PRN
  Administered 2024-02-22 (×2): 1 mg via INTRAVENOUS

## 2024-02-22 MED ORDER — HYDRALAZINE HCL 20 MG/ML IJ SOLN: 10.0000 mg | INTRAMUSCULAR | Status: AC | PRN

## 2024-02-22 MED ORDER — IOHEXOL 350 MG/ML SOLN
INTRAVENOUS | Status: DC | PRN
  Administered 2024-02-22: 30 mL

## 2024-02-22 MED ORDER — ENOXAPARIN SODIUM 40 MG/0.4ML IJ SOSY
40.0000 mg | PREFILLED_SYRINGE | INTRAMUSCULAR | Status: DC
  Administered 2024-02-23: 40 mg via SUBCUTANEOUS
  Filled 2024-02-22: qty 0.4

## 2024-02-22 MED ORDER — SODIUM CHLORIDE 0.9% FLUSH: 3.0000 mL | INTRAVENOUS | Status: DC | PRN

## 2024-02-22 MED ORDER — SODIUM CHLORIDE 0.9% FLUSH
3.0000 mL | Freq: Two times a day (BID) | INTRAVENOUS | Status: DC
  Administered 2024-02-22 – 2024-02-23 (×2): 3 mL via INTRAVENOUS

## 2024-02-22 MED ORDER — MIDAZOLAM HCL 2 MG/2ML IJ SOLN
INTRAMUSCULAR | Status: AC
  Filled 2024-02-22: qty 2

## 2024-02-22 MED ORDER — CLOPIDOGREL BISULFATE 75 MG PO TABS
75.0000 mg | ORAL_TABLET | Freq: Every day | ORAL | Status: DC
  Administered 2024-02-22 – 2024-02-23 (×2): 75 mg via ORAL
  Filled 2024-02-22 (×2): qty 1

## 2024-02-22 MED ORDER — HEPARIN (PORCINE) IN NACL 1000-0.9 UT/500ML-% IV SOLN
INTRAVENOUS | Status: DC | PRN
  Administered 2024-02-22: 1000 mL

## 2024-02-22 MED ORDER — EMPAGLIFLOZIN 10 MG PO TABS
10.0000 mg | ORAL_TABLET | Freq: Every day | ORAL | Status: DC
  Administered 2024-02-22 – 2024-02-23 (×2): 10 mg via ORAL
  Filled 2024-02-22 (×2): qty 1

## 2024-02-22 MED ORDER — FENTANYL CITRATE (PF) 100 MCG/2ML IJ SOLN
INTRAMUSCULAR | Status: DC | PRN
  Administered 2024-02-22 (×2): 25 ug via INTRAVENOUS

## 2024-02-22 MED ORDER — CARVEDILOL 3.125 MG PO TABS
3.1250 mg | ORAL_TABLET | Freq: Two times a day (BID) | ORAL | Status: DC
  Administered 2024-02-22 – 2024-02-23 (×3): 3.125 mg via ORAL
  Filled 2024-02-22 (×3): qty 1

## 2024-02-22 MED ORDER — LOSARTAN POTASSIUM 25 MG PO TABS
25.0000 mg | ORAL_TABLET | Freq: Every day | ORAL | Status: DC
  Administered 2024-02-22: 25 mg via ORAL
  Filled 2024-02-22: qty 1

## 2024-02-22 MED ORDER — HEPARIN SODIUM (PORCINE) 1000 UNIT/ML IJ SOLN
INTRAMUSCULAR | Status: DC | PRN
  Administered 2024-02-22: 4000 [IU] via INTRAVENOUS

## 2024-02-22 MED ORDER — FENTANYL CITRATE (PF) 100 MCG/2ML IJ SOLN
INTRAMUSCULAR | Status: AC
  Filled 2024-02-22: qty 2

## 2024-02-22 MED ORDER — VERAPAMIL HCL 2.5 MG/ML IV SOLN
INTRAVENOUS | Status: DC | PRN
  Administered 2024-02-22: 10 mL via INTRA_ARTERIAL

## 2024-02-22 MED ORDER — ISOSORBIDE MONONITRATE ER 30 MG PO TB24
30.0000 mg | ORAL_TABLET | Freq: Every day | ORAL | Status: DC
  Administered 2024-02-22 – 2024-02-23 (×2): 30 mg via ORAL
  Filled 2024-02-22 (×2): qty 1

## 2024-02-22 MED ORDER — VERAPAMIL HCL 2.5 MG/ML IV SOLN
INTRAVENOUS | Status: AC
  Filled 2024-02-22: qty 2

## 2024-02-22 MED ORDER — SACUBITRIL-VALSARTAN 24-26 MG PO TABS
1.0000 | ORAL_TABLET | Freq: Two times a day (BID) | ORAL | Status: DC
  Filled 2024-02-22: qty 1

## 2024-02-22 MED ORDER — LIDOCAINE HCL (PF) 1 % IJ SOLN
INTRAMUSCULAR | Status: DC | PRN
  Administered 2024-02-22: 2 mL

## 2024-02-22 MED ORDER — SODIUM CHLORIDE 0.9 % IV SOLN: 250.0000 mL | INTRAVENOUS | Status: DC | PRN

## 2024-02-22 MED ORDER — HEPARIN SODIUM (PORCINE) 1000 UNIT/ML IJ SOLN
INTRAMUSCULAR | Status: AC
  Filled 2024-02-22: qty 10

## 2024-02-22 SURGICAL SUPPLY — 10 items
CATH 5FR JL3.5 JR4 ANG PIG MP (CATHETERS) IMPLANT
CATH BALLN WEDGE 5F 110CM (CATHETERS) IMPLANT
DEVICE RAD COMP TR BAND LRG (VASCULAR PRODUCTS) IMPLANT
GLIDESHEATH SLEND SS 6F .021 (SHEATH) IMPLANT
GUIDEWIRE INQWIRE 1.5J.035X260 (WIRE) IMPLANT
INQWIRE 1.5J .035X260CM (WIRE) ×1 IMPLANT
KIT SYRINGE INJ CVI SPIKEX1 (MISCELLANEOUS) IMPLANT
PACK CARDIAC CATHETERIZATION (CUSTOM PROCEDURE TRAY) ×1 IMPLANT
SET ATX-X65L (MISCELLANEOUS) IMPLANT
SHEATH GLIDE SLENDER 4/5FR (SHEATH) IMPLANT

## 2024-02-22 NOTE — Progress Notes (Signed)
 PROGRESS NOTE    Isaac Wilkerson  UJW:[id number] DOB: 02/14/1966 DOA: 02/19/2024 PCP: Toma Deiters, MD   57/M with chronic combined CHF with recovered EF, polysubstance abuse including tobacco EtOH and cocaine, nonobstructive CAD presented to ED at [facility] with worsening shortness of breath.  Patient reports some progressive dyspnea over 2 to 3 days, acutely worsened with some orthopnea.  He denied any chest pain. -ED Course: Hypertensive, EKG with LVH, T wave inversion in lateral leads, initial troponin was 10K, peaked at 11K and then started trending down, chest x-ray suggestive of CHF.  Transferred to [facility] for cardiology eval -Admitted, started on diuretics -LHC 3/3 with moderate nonobstructive CAD, mildly elevated filling pressures   Subjective: Feels better overall, just back from cath  Assessment and Plan:    NSTEMI (non-ST elevated myocardial infarction) (HCC) -HS troponin peaked at 11K at [facility] R,  -Cardiology following, R/LHC with moderate nonobstructive CAD --Continue aspirin, statin, Imdur, starting Coreg   Acute on chronic combined CHF -EF was down to 25-30% in 4/27, subsequently recovered to 65-70% in late 2017 and 2019 -Repeat echo with EF down to 30-35%, global hypokinesis, grade 3 DD -R/LHC with moderate nonobstructive CAD, mildly elevated filling pressures -started on losartan today per cards, add Jardiance -Holding further diuretics, reassess tomorrow   Essential hypertension -Continue Imdur, discontinue amlodipine -Diuretics as noted above   Tobacco abuse   Polysubstance abuse (HCC)   Alcohol abuse -Counseled   Mild AKI,  -Creatinine 1.37 at [facility] ED yesterday, slightly higher than baseline -Monitor   Suspected COPD -Poor air movement, no wheezing at this time, monitor     DVT prophylaxis: SCDs Code Status: Full code Family Communication: None present, called and updated spouse Disposition Plan: Home tomorrow  Consultants:     Procedures:   Antimicrobials:    Objective: Vitals:   02/22/24 0811 02/22/24 0827 02/22/24 0833 02/22/24 0951  BP: (!) 154/110 (!) 153/107 (!) 150/100 (!) 155/94  Pulse: (!) 0  87 75  Resp:  16 20   Temp:   (!) 97.5 F (36.4 C)   TempSrc:   Axillary   SpO2: 94%  96%   Weight:      Height:        Intake/Output Summary (Last 24 hours) at 02/22/2024 1017 Last data filed at 02/22/2024 0654 Gross per 24 hour  Intake 1195.74 ml  Output 2325 ml  Net -1129.26 ml   Filed Weights   02/20/24 0420 02/21/24 0443 02/22/24 0500  Weight: 76.2 kg 76.6 kg 75.5 kg    Examination:  Gen: Awake, Alert, Oriented X 3,  HEENT: + JVD Lungs: Good air movement bilaterally, CTAB CVS: S1S2/RRR Abd: soft, Non tender, non distended, BS present Extremities: No edema Skin: no new rashes on exposed skin    Data Reviewed:   CBC: Recent Labs  Lab 02/19/24 1732 02/20/24 0147 02/21/24 0236 02/22/24 0258  WBC 5.4 6.2 6.1 7.4  HGB 11.0* 10.9* 10.7* 11.9*  HCT 34.9* 34.1* 34.2* 37.7*  MCV 95.6 95.3 95.8 94.5  PLT 249 266 262 288   Basic Metabolic Panel: Recent Labs  Lab 02/19/24 1732 02/20/24 0147 02/21/24 0236 02/22/24 0258  NA 140 139 140 138  K 4.1 3.3* 3.9 3.5  CL 105 102 103 102  CO2 27 27 26 27   GLUCOSE 108* 100* 125* 115*  BUN 13 12 17  23*  CREATININE 1.33* 1.50* 1.33* 1.48*  CALCIUM 8.7* 8.5* 9.5 9.9   GFR: Estimated Creatinine Clearance: 49.1 mL/min (  A) (by C-G formula based on SCr of 1.48 mg/dL (H)). Liver Function Tests: Recent Labs  Lab 02/19/24 1732  AST 21  ALT 19  ALKPHOS 50  BILITOT 1.6*  PROT 6.4*  ALBUMIN 2.9*   No results for input(s): "LIPASE", "AMYLASE" in the last 168 hours. No results for input(s): "AMMONIA" in the last 168 hours. Coagulation Profile: Recent Labs  Lab 02/19/24 1732  INR 1.1   Cardiac Enzymes: No results for input(s): "CKTOTAL", "CKMB", "CKMBINDEX", "TROPONINI" in the last 168 hours. BNP (last 3 results) No results for  input(s): "PROBNP" in the last 8760 hours. HbA1C: No results for input(s): "HGBA1C" in the last 72 hours. CBG: Recent Labs  Lab 02/19/24 1624  GLUCAP 90   Lipid Profile: No results for input(s): "CHOL", "HDL", "LDLCALC", "TRIG", "CHOLHDL", "LDLDIRECT" in the last 72 hours. Thyroid Function Tests: No results for input(s): "TSH", "T4TOTAL", "FREET4", "T3FREE", "THYROIDAB" in the last 72 hours. Anemia Panel: No results for input(s): "VITAMINB12", "FOLATE", "FERRITIN", "TIBC", "IRON", "RETICCTPCT" in the last 72 hours. Urine analysis: No results found for: "COLORURINE", "APPEARANCEUR", "LABSPEC", "PHURINE", "GLUCOSEU", "HGBUR", "BILIRUBINUR", "KETONESUR", "PROTEINUR", "UROBILINOGEN", "NITRITE", "LEUKOCYTESUR" Sepsis Labs: @LABRCNTIP (procalcitonin:4,lacticidven:4)  )No results found for this or any previous visit (from the past 240 hours).   Radiology Studies: CARDIAC CATHETERIZATION Result Date: 02/22/2024   Prox RCA lesion is 60% stenosed.   Lat 1st Diag lesion is 50% stenosed.   Prox Cx to Mid Cx lesion is 45% stenosed.   LV end diastolic pressure is mildly elevated.   There is no aortic valve stenosis.   Anticipated discharge date to be determined.   Recommend uninterrupted dual antiplatelet therapy with Aspirin 81mg  daily and Clopidogrel 75mg  daily. Conclusions: Moderate multivessel coronary artery disease, including 50% D1, 40-50% proximal/mid LCx, and 60% proximal RCA lesions.  No critical stenosis identified to explain the patient's elevated troponin.  Question if this represents stress-induced cardiomyopathy in the setting of reduced LVEF and recent stressor (mother's stroke). Upper normal to mildly elevated left and right heart filling pressures. Mild pulmonary hypertension. Mildly reduced Fick cardiac output/index. Recommendations: Aggressive secondary prevention of coronary artery disease; I will add clopidogrel with plans for 12 months of DAPT in the setting of medically managed  MINOCA.  Switch pravastatin to atorvastatin as well. Hold aggressive diuresis and add low-dose carvedilol and losartan with escalation of GDMT as tolerated. Yvonne Kendall, MD [facility]    Scheduled Meds:  aspirin EC  81 mg Oral Daily   [START ON 02/23/2024] atorvastatin  40 mg Oral Daily   carvedilol  3.125 mg Oral BID WC   clopidogrel  75 mg Oral Daily   empagliflozin  10 mg Oral Daily   [START ON 02/23/2024] enoxaparin (LOVENOX) injection  40 mg Subcutaneous Q24H   feeding supplement  237 mL Oral BID BM   isosorbide mononitrate  30 mg Oral Daily   losartan  25 mg Oral Daily   sodium chloride flush  3 mL Intravenous Q12H   thiamine  100 mg Oral Daily   Continuous Infusions:     LOS: 3 days    Time spent:    Zannie Cove, MD [facility]   02/22/2024, 10:17 AM

## 2024-02-22 NOTE — Progress Notes (Addendum)
 Patient Name: Isaac Wilkerson Date of Encounter: 02/22/2024 [facility] Cardiologist: Dina Rich, MD   Interval Summary  .    58 y.o. male with a hx of hypertension, chronic combined CHF, CAD ( moderate RCA stenosis, not significant disease by FFR on CT 2019, tobacco use, polysubstance abuse with cocaine, transferred from [facility] to [facility] for NSTEMI.  Cardiology is following for NSTEMI and CHF.   Patient underwent R/L heart cath today. He feels well, denied any chest pain or SOB. He is agreeable with medication change. He states he needs to stop drinking ETOH and use cocaine.   Vital Signs .    Vitals:   02/22/24 0811 02/22/24 0827 02/22/24 0833 02/22/24 0951  BP: (!) 154/110 (!) 153/107 (!) 150/100 (!) 155/94  Pulse: (!) 0  87 75  Resp:  16 20   Temp:   (!) 97.5 F (36.4 C)   TempSrc:   Axillary   SpO2: 94%  96%   Weight:      Height:        Intake/Output Summary (Last 24 hours) at 02/22/2024 1102 Last data filed at 02/22/2024 0654 Gross per 24 hour  Intake 1195.74 ml  Output 2325 ml  Net -1129.26 ml      02/22/2024    5:00 AM 02/21/2024    4:43 AM 02/20/2024    4:20 AM  Last 3 Weights  Weight (lbs) 166 lb 7.2 oz 168 lb 14 oz 167 lb 15.9 oz  Weight (kg) 75.5 kg 76.6 kg 76.2 kg      Telemetry/ECG    Sinus rhythm 80-90s, sinus bradycardia 50s early morning noted - Personally Reviewed  Physical Exam .   GEN: No acute distress.   Neck: No JVD Cardiac: RRR, no murmurs, rubs, or gallops.  Respiratory: Clear to auscultation bilaterally. On room air.  GI: Soft, nontender, non-distended  MS: No leg edema Right wrist with TR band in place, no bleeding noted   Assessment & Plan .     NSTEMI Multivessel non-obstructive CAD - presented to [facility] on 02/18/2024 complaining of shortness of breath, productive cough, worse with exertion  - troponin was elevated at 10,777 > 11,329 > 9,186 > 8,104 > 5,846. Pro-BNP was 13,000 at [facility] - Echo 3/1 showed LVEF  30-35%, global hypokinesis, mild LVH, grade III DD, Elevated left ventricular end-diastolic pressure. Mod reduced RV. PASP 67.1 mmHg, severe LAE and RAE, mod to severe MR, IVC dilated  - R/L heart cath today showed moderate multivessel coronary artery disease, including 50% D1, 40-50% proximal/mid LCx, and 60% proximal RCA lesions. No critical stenosis identified to explain the patient's elevated troponin. Query stress-induced cardiomyopathy. Mildly elevated left and right heart filling pressures. Mild pulmonary hypertension. Mildly reduced Fick cardiac output/index.  RA3, RV 46/10, PA 46/23, mean 31, PCWP 18. 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 +/- ETOH and cocaine abuse  -  see work up above -  s/p diuresis, Net - 2.7L, weight down from 170.2 > 166.45ibs, labs with mild raising Cr - Hold further diuresis today , track I&o, clinically fairly  euvolemic  - GDMT: started coreg 3.125mg  BID, will switch losartan to Entresto 24/26mg  BID today, started  jardiance 10mg , can stop PTA lisinopril 40mg , trend BMP  - will involve TOC CHF clinic for arranging follow up and refer to AHF clinic if appropriate   HTN - BP on the high end, may have more room for GDMT  AKI Tobacco abuse Polysubstance abuse Alcohol abuse Anemia  - per primary team    For questions or updates, please contact [facility] Please consult [email or web address] for contact info under        Signed, Cyndi Bender, NP   Personally seen and examined. Agree with above.  58 year old with right and left heart  catheterization showing moderate multivessel coronary artery disease.  No PCI performed.  Medical management.  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  Holding diuresis, wedge 18 -Coreg, Entresto, started Jardiance  Tobacco use/cocaine use/alcohol use/polysubstance abuse - Continue to encourage cessation.  Cardiac catheterization site clean dry and intact, bandage in place  Donato Schultz, MD

## 2024-02-22 NOTE — Plan of Care (Signed)

## 2024-02-22 NOTE — Progress Notes (Signed)
 Heart Failure Nurse Navigator Progress Note  PCP: Toma Deiters, MD PCP-Cardiologist: Branch Admission Diagnosis: NSTEMI Admitted from: [facility]  Presentation:   Isaac Wilkerson presented with shortness of breath, productive cough, EKG with NSR with LVH, and possible T wave abnormality, prolonged QT. CXR showed cardiomegaly with diffuse bilateral interstitial opacities with suggestion of CHF. Troponin 11,329, BP 156/119, HR 99, R/L heart cath 02/22/24 showed moderate multivessel CAD, Mildly elevated left and right heart filing pressures, Mildly pulmonary hypertension. Plan for medical therapy at this time.   Patient and his son ( over phone) were educated on the sign and symptoms of heart failure, daily weights, when to call his doctor or go to the ED, Diet/ fluid restrictions, ( patient reports to eating recently a lot of processed foods with salt , and drinking around 24 beers daily) Patient was educated on taking all medications as prescribed, he admitted that he has not been taking them as he should, and he has also been doing cocaine , last used 1 week ago. Patient and son were explained the importance of him following up with his HF [facility] appointment and all other medical appointments. Patient and son verbalized their understanding of education, son stated he would be driving his dad to the appointment. A HF TOC hospital follow up appointment was scheduled for 03/01/2024 # 3 :30 pm.    ECHO/ LVEF: 30-35%  Clinical Course:  Past Medical History:  Diagnosis Date   Alcohol abuse    CAD (coronary artery disease)    Chronic combined systolic and diastolic CHF (congestive heart failure) (HCC)    Cocaine use    Hypertension      Social History   Socioeconomic History   Marital status: Single    Spouse name: Not on file   Number of children: Not on file   Years of education: Not on file   Highest education level: Not on file  Occupational History   Occupation: cook  Tobacco Use    Smoking status: Every Day    Current packs/day: 0.00    Average packs/day: 0.5 packs/day for 40.0 years (20.0 ttl pk-yrs)    Types: Cigarettes    Start date: 10/12/1976    Last attempt to quit: 10/12/2016    Years since quitting: 7.3   Smokeless tobacco: Never   Tobacco comments:    Only smokes when he drinks  Vaping Use   Vaping status: Never Used  Substance and Sexual Activity   Alcohol use: Yes    Alcohol/week: 3.0 standard drinks of alcohol    Types: 3 Cans of beer per week    Comment: Previously listed as heavy, currently 3 beers 3x/week   Drug use: Yes    Types: Cocaine, Marijuana    Comment: every so often   Sexual activity: Not on file  Other Topics Concern   Not on file  Social History Narrative   Not on file   Social Drivers of Health   Financial Resource Strain: Low Risk  (12/18/2023)   Received from [facility]   Overall Financial Resource Strain (CARDIA)    Difficulty of Paying Living Expenses: Not very hard  Food Insecurity: No Food Insecurity (02/19/2024)   Hunger Vital Sign    Worried About Running Out of Food in the Last Year: Never true    Ran Out of Food in the Last Year: Never true  Transportation Needs: No Transportation Needs (02/19/2024)   PRAPARE - Transportation    Lack of Transportation (  Medical): No    Lack of Transportation (Non-Medical): No  Physical Activity: Not on file  Stress: Not on file  Social Connections: Not on file   Education Assessment and Provision:  Detailed education and instructions provided on heart failure disease management including the following:  Signs and symptoms of Heart Failure When to call the physician Importance of daily weights Low sodium diet Fluid restriction Medication management Anticipated future follow-up appointments  Patient education given on each of the above topics.  Patient acknowledges understanding via teach back method and acceptance of all instructions.  Education Materials:  "Living  Better With Heart Failure" Booklet, HF zone tool, & Daily Weight Tracker Tool.  Patient has scale at home: yes Patient has pill box at home: No, will buy one at discharge    High Risk Criteria for Readmission and/or Poor Patient Outcomes: Heart failure hospital admissions (last 6 months): 2  No Show rate: 9 % Difficult social situation: No, lives in a apartment.  Demonstrates medication adherence: No, per son.  Primary Language: English Literacy level: Reading, writing, and comprehension on 7 th grade level.   Barriers of Care:   Medication compliance Substance History (ETOH/ cocaine) Daily/ fluid restrictions/ daily weights  Considerations/Referrals:   Referral made to Heart Failure Pharmacist Stewardship: yes Referral made to Heart Failure CSW/NCM TOC: Yes, substance cessation ( ETOH/ cocaine) Referral made to Heart & Vascular TOC clinic: Yes, 03/01/2024 @ 3:30 pm   Items for Follow-up on DC/TOC: Medication compliance Diet/ fluid restrictions/ daily weights Substance cessation Continued HF education    Rhae Hammock, BSN, RN Heart Failure Teacher, adult education Only

## 2024-02-22 NOTE — Discharge Instructions (Signed)

## 2024-02-22 NOTE — Care Management Important Message (Signed)
 Important Message  Patient Details  Name: Isaac Wilkerson MRN: [id number] Date of Birth: 1966/11/27   Important Message Given:  Yes - Medicare IM     [name] 02/22/2024, 11:16 AM

## 2024-02-22 NOTE — Plan of Care (Signed)

## 2024-02-22 NOTE — Progress Notes (Signed)
 Heart Failure Stewardship Pharmacist Progress Note   PCP: Toma Deiters, MD PCP-Cardiologist: Dina Rich, MD    HPI:  58 yo M with PMH of CHF, HTN, NSTEMI, substance use, and medication noncompliance.   Presented to [facility] on 2/27 with shortness of breath and productive cough. Denied chest pain. Reports substance use with tobacco, alcohol, and cocaine. CXR at [facility] with cardiomegaly and bilateral interstitial opacities suggestive of CHF, trop elevated 10,777 >> 5,846, pro-BNP 13000. He was started on IV lasix and transferred to [facility] for cardiology services. Notable for medication noncompliance. ECHO 3/1 showed LVEF 30-35% (was 55-60% in 2019; 25-30% in 2017), global hypokinesis, mild LV hypertrophy, G3DD, RV moderately reduced, severely elevated PA pressure, moderate to severe MR. Coronary CTA (2019) showed moderate RCA stenosis, deemed not significant disease by FFR. Taken for [facility] on 3/3 and found to have moderate multivessel CAD, plan for medical management. RA 3, PA 31, wedge 18, CO 3.9, CI 2.1.   Patient denies shortness of breath. Able to lay flat. No LE edema on exam. States that he feels "100% better." Discussed medication compliance after discharge. He states his son is buying him a pill box for when he gets home and will help set up his medications for him on a weekly basis. Agreeable to using [facility] pharmacy on discharge. Patient prefers refills go to [facility] afterward.   Current HF Medications: Beta Blocker: carvedilol 3.125 mg BID ACE/ARB/ARNI: Entresto 24/26 mg BID SGLT2i: Jardiance 10 mg daily Other: Imdur 30 mg daily  Prior to admission HF Medications: Diuretic: furosemide 40 mg daily ACE/ARB/ARNI: lisinopril 40 mg daily Other: Imdur 60 mg daily  Pertinent Lab Values: Serum creatinine 1.48, BUN 23, Potassium 4.1, Sodium 139, pro-BNP 13,000, A1c 4.6   Vital Signs: Weight: 166 lbs (admission weight: 170 lbs) Blood pressure: 130-150/90s  Heart  rate: 70-80s  I/O: net -1.7L yesterday; net -2.3L since admission  Medication Assistance / Insurance Benefits Check: Does the patient have prescription insurance?  Yes Type of insurance plan: [place] Medicaid  Outpatient Pharmacy:  Prior to admission outpatient pharmacy: [facility] Is the patient willing to use [facility] TOC pharmacy at discharge? Yes Is the patient willing to transition their outpatient pharmacy to utilize a [facility] outpatient pharmacy?   No    Assessment: 1. Acute on chronic systolic CHF (LVEF 30-35%), due to likely Takotsubo in the setting of cocaine and alcohol use. NYHA class II symptoms. - Now off IV lasix. Last dose of IV lasix 80 mg given 3/2 @ 1800. Mildly volume overloaded on cath today but appears euvolemic on exam. With creatinine rising and addition of Entresto and Jardiance, will hold off additional lasix at this time. Strict I/Os and daily weights. Keep K>4 and Mg>2. - Agree with adding carvedilol 3.125 mg BID. CO 3.9 and CI 2.1 on RHC today. Carvedilol is the preferred BB in the setting of cocaine use.  - Agree with transitioning from losartan 25 mg daily to Entresto 24/26 mg BID. Losartan given this morning and Entresto scheduled to start tomorrow.  - Consider starting spironolactone prior to discharge vs follow up pending trends in SCr.  - Agree with starting Jardiance 10 mg daily   Plan: 1) Medication changes recommended at this time: - Recommend checking magnesium in AM   2) Patient assistance: - Entresto copay $0 - Farxiga/Jardiance copay $0  3)  Education  - Patient has been educated on current HF medications and potential additions to HF medication regimen -  Patient verbalizes understanding that over the next few months, these medication doses may change and more medications may be added to optimize HF regimen - Patient has been educated on basic disease state pathophysiology and goals of therapy   Sharen Hones, PharmD, BCPS Heart Failure  Stewardship Pharmacist Phone [phone]

## 2024-02-22 NOTE — Interval H&P Note (Signed)
 History and Physical Interval Note:  02/22/2024 7:34 AM  Isaac Wilkerson  has presented today for surgery, with the diagnosis of NSTEMI and acute HFrEF.  The various methods of treatment have been discussed with the patient and family. After consideration of risks, benefits and other options for treatment, the patient has consented to  Procedure(s): RIGHT/LEFT HEART CATH AND CORONARY ANGIOGRAPHY (N/A) as a surgical intervention.  The patient's history has been reviewed, patient examined, no change in status, stable for surgery.  I have reviewed the patient's chart and labs.  Questions were answered to the patient's satisfaction.    Cath Lab Visit (complete for each Cath Lab visit)  Clinical Evaluation Leading to the Procedure:   ACS: Yes.    Non-ACS:  N/A  [place]

## 2024-02-22 NOTE — Telephone Encounter (Signed)
 Patient Product/process development scientist completed.    The patient is insured through [employer]. Patient has Medicare and is not eligible for a copay card, but may be able to apply for patient assistance or Medicare RX Payment Plan (Patient Must reach out to their plan, if eligible for payment plan), if available.    Ran test claim for Entresto 24-26 mg and the current 30 day co-pay is $0.00.  Ran test claim for Farxiga 10 mg and the current 30 day co-pay is $0.00.  Ran test claim for Jardiance 10 mg and the current 30 day co-pay is $0.00.   This test claim was processed through [facility]- copay amounts may vary at other pharmacies due to pharmacy/plan contracts, or as the patient moves through the different stages of their insurance plan.     Roland Earl, CPHT Pharmacy Technician III Certified Patient Advocate [facility] Patient Advocate Team Direct Number: [phone]  Fax: [phone]

## 2024-02-23 ENCOUNTER — Other Ambulatory Visit (HOSPITAL_COMMUNITY): Payer: Self-pay

## 2024-02-23 DIAGNOSIS — I5181 Takotsubo syndrome: Secondary | ICD-10-CM | POA: Insufficient documentation

## 2024-02-23 DIAGNOSIS — I21A1 Myocardial infarction type 2: Secondary | ICD-10-CM | POA: Insufficient documentation

## 2024-02-23 DIAGNOSIS — I5043 Acute on chronic combined systolic (congestive) and diastolic (congestive) heart failure: Secondary | ICD-10-CM | POA: Diagnosis not present

## 2024-02-23 LAB — BASIC METABOLIC PANEL
Anion gap: 9 (ref 5–15)
BUN: 30 mg/dL — ABNORMAL HIGH (ref 6–20)
CO2: 25 mmol/L (ref 22–32)
Calcium: 9.7 mg/dL (ref 8.9–10.3)
Chloride: 103 mmol/L (ref 98–111)
Creatinine, Ser: 1.53 mg/dL — ABNORMAL HIGH (ref 0.61–1.24)
GFR, Estimated: 52 mL/min — ABNORMAL LOW (ref >60)
Glucose, Bld: 100 mg/dL — ABNORMAL HIGH (ref 70–99)
Potassium: 4.2 mmol/L (ref 3.5–5.1)
Sodium: 137 mmol/L (ref 135–145)

## 2024-02-23 LAB — CBC
HCT: 38.2 % — ABNORMAL LOW (ref 39.0–52.0)
Hemoglobin: 12.1 g/dL — ABNORMAL LOW (ref 13.0–17.0)
MCH: 30.4 pg (ref 26.0–34.0)
MCHC: 31.7 g/dL (ref 30.0–36.0)
MCV: 96 fL (ref 80.0–100.0)
Platelets: 305 10*3/uL (ref 150–400)
RBC: 3.98 MIL/uL — ABNORMAL LOW (ref 4.22–5.81)
RDW: 13.7 % (ref 11.5–15.5)
WBC: 6.3 10*3/uL (ref 4.0–10.5)
nRBC: 0 % (ref 0.0–0.2)

## 2024-02-23 LAB — MAGNESIUM: Magnesium: 2.1 mg/dL (ref 1.7–2.4)

## 2024-02-23 MED ORDER — CARVEDILOL 6.25 MG PO TABS: 6.2500 mg | ORAL_TABLET | Freq: Two times a day (BID) | ORAL | Status: DC

## 2024-02-23 MED ORDER — CLOPIDOGREL BISULFATE 75 MG PO TABS
75.0000 mg | ORAL_TABLET | Freq: Every day | ORAL | 1 refills | Status: DC
  Filled 2024-02-23: qty 30, 30d supply, fill #0

## 2024-02-23 MED ORDER — CARVEDILOL 6.25 MG PO TABS
6.2500 mg | ORAL_TABLET | Freq: Two times a day (BID) | ORAL | 1 refills | Status: DC
  Filled 2024-02-23: qty 60, 30d supply, fill #0

## 2024-02-23 MED ORDER — SACUBITRIL-VALSARTAN 49-51 MG PO TABS
1.0000 | ORAL_TABLET | Freq: Two times a day (BID) | ORAL | Status: DC
  Administered 2024-02-23: 1 via ORAL
  Filled 2024-02-23: qty 1

## 2024-02-23 MED ORDER — ISOSORBIDE MONONITRATE ER 30 MG PO TB24
30.0000 mg | ORAL_TABLET | Freq: Every day | ORAL | 0 refills | Status: DC
  Filled 2024-02-23: qty 30, 30d supply, fill #0

## 2024-02-23 MED ORDER — SACUBITRIL-VALSARTAN 49-51 MG PO TABS
1.0000 | ORAL_TABLET | Freq: Two times a day (BID) | ORAL | 1 refills | Status: DC
  Filled 2024-02-23: qty 60, 30d supply, fill #0

## 2024-02-23 MED ORDER — FUROSEMIDE 40 MG PO TABS
40.0000 mg | ORAL_TABLET | ORAL | 0 refills | Status: DC | PRN
  Filled 2024-02-23: qty 15, 15d supply, fill #0

## 2024-02-23 MED ORDER — EMPAGLIFLOZIN 10 MG PO TABS
10.0000 mg | ORAL_TABLET | Freq: Every day | ORAL | 1 refills | Status: DC
  Filled 2024-02-23: qty 30, 30d supply, fill #0

## 2024-02-23 MED ORDER — ATORVASTATIN CALCIUM 40 MG PO TABS
40.0000 mg | ORAL_TABLET | Freq: Every day | ORAL | 1 refills | Status: DC
  Filled 2024-02-23: qty 30, 30d supply, fill #0

## 2024-02-23 NOTE — Progress Notes (Signed)
 CARDIAC REHAB PHASE I    Pt resting in bed, feeling well today. Pt ambulating independently with no CP, SOB or dizziness. Post MI education including restrictions, risk factors, exercise guidelines, antiplatelet therapy importance, MI booklet, NTG use, heart healthy diet, smoking cessation and CRP2 reviewed. All questions and concerns addressed. Will refer to AP  for CRP2. HF education completed via HF navigator. Plan for possible  discharge later today.   [id number] Woodroe Chen, RN BSN 02/23/2024 9:35 AM

## 2024-02-23 NOTE — Progress Notes (Signed)
 Heart Failure Stewardship Pharmacist Progress Note   PCP: Toma Deiters, MD PCP-Cardiologist: Dina Rich, MD    HPI:  58 yo M with PMH of CHF, HTN, NSTEMI, substance use, and medication noncompliance.   Presented to [facility] on 2/27 with shortness of breath and productive cough. Denied chest pain. Reports substance use with tobacco, alcohol, and cocaine. CXR at [facility] with cardiomegaly and bilateral interstitial opacities suggestive of CHF, trop elevated 10,777 >> 5,846, pro-BNP 13000. He was started on IV lasix and transferred to [facility] for cardiology services. Notable for medication noncompliance. ECHO 3/1 showed LVEF 30-35% (was 55-60% in 2019; 25-30% in 2017), global hypokinesis, mild LV hypertrophy, G3DD, RV moderately reduced, severely elevated PA pressure, moderate to severe MR. Coronary CTA (2019) showed moderate RCA stenosis, deemed not significant disease by FFR. Taken for [facility] on 3/3 and found to have moderate multivessel CAD, plan for medical management. RA 3, PA 31, wedge 18, CO 3.9, CI 2.1.   Patient denies shortness of breath. Able to lay flat. No LE edema on exam. States that he feels "100% better." Rediscussed medication compliance after discharge. He told me yesterday that his son is buying him a pill box for when he gets home and will help set up his medications for him on a weekly basis. Agreeable to using [facility] pharmacy on discharge. Patient prefers refills go to [facility] afterward.   Discharge HF Medications: Diuretic: furosemide 40 mg daily PRN Beta Blocker: carvedilol 6.25 mg BID ACE/ARB/ARNI: Entresto 49/51 mg BID SGLT2i: Jardiance 10 mg daily Other: Imdur 30 mg daily  Prior to admission HF Medications: Diuretic: furosemide 40 mg daily ACE/ARB/ARNI: lisinopril 40 mg daily Other: Imdur 60 mg daily  Pertinent Lab Values: Serum creatinine 1.53, BUN 30, Potassium 4.2, Sodium 137, pro-BNP 13,000, A1c 4.6, Magnesium 2.1  Vital Signs: Weight:  167 lbs (admission weight: 170 lbs) Blood pressure: 150/90s  Heart rate: 60-90s  I/O: incomplete yesterday; net -1L since admission  Medication Assistance / Insurance Benefits Check: Does the patient have prescription insurance?  Yes Type of insurance plan: [place] [employer]  Outpatient Pharmacy:  Prior to admission outpatient pharmacy: [facility] Is the patient willing to use [facility] TOC pharmacy at discharge? Yes Is the patient willing to transition their outpatient pharmacy to utilize a [facility] outpatient pharmacy?   No    Assessment: 1. Acute on chronic systolic CHF (LVEF 30-35%), due to likely Takotsubo in the setting of cocaine and alcohol use. NYHA class II symptoms. - Now off IV lasix. Last dose of IV lasix 80 mg given 3/2 @ 1800. Mildly volume overloaded on cath but appears euvolemic on exam. Agree with furosemide 40 mg daily PRN at discharge. Strict I/Os and daily weights. Keep K>4 and Mg>2. - Agree with increasing to carvedilol 6.25 mg BID. CO 3.9 and CI 2.1 on RHC. Carvedilol is the preferred BB in the setting of cocaine use.  - Agree with increasing to Entresto 49/51 mg BID for discharge (never received dose of 24/26 mg but BP likely can tolerate increased dose) - Consider starting spironolactone prior to discharge vs follow up pending trends in SCr.  - Continue Jardiance 10 mg daily   Plan: 1) Medication changes recommended at this time: - Agree with changes; discharge today  2) Patient assistance: - Entresto copay $0 - Farxiga/Jardiance copay $0  3)  Education  - Patient has been educated on current HF medications and potential additions to HF medication regimen - Patient verbalizes understanding that  over the next few months, these medication doses may change and more medications may be added to optimize HF regimen - Patient has been educated on basic disease state pathophysiology and goals of therapy   Sharen Hones, PharmD, BCPS Heart Failure Stewardship  Pharmacist Phone [phone]

## 2024-02-23 NOTE — Plan of Care (Signed)

## 2024-02-23 NOTE — Progress Notes (Addendum)
   Patient Name: Isaac Wilkerson Date of Encounter: 02/23/2024 [facility] Cardiologist: Dina Rich, MD   Interval Summary  .    Feels well, ready to go home.  Blood pressure has been elevated.  Vital Signs .    Vitals:   02/23/24 0031 02/23/24 0403 02/23/24 0730 02/23/24 0754  BP: (!) 147/85 (!) 154/101 (!) 159/100 (!) 159/100  Pulse: 84 82 88 82  Resp: 20 19 18    Temp: 98 F (36.7 C) (!) 97.5 F (36.4 C) (!) 97.5 F (36.4 C)   TempSrc: Oral Oral Oral   SpO2: 98% 99% 97%   Weight:  76.2 kg    Height:        Intake/Output Summary (Last 24 hours) at 02/23/2024 0852 Last data filed at 02/23/2024 0848 Gross per 24 hour  Intake 1971 ml  Output 225 ml  Net 1746 ml      02/23/2024    4:03 AM 02/22/2024    5:00 AM 02/21/2024    4:43 AM  Last 3 Weights  Weight (lbs) 167 lb 15.9 oz 166 lb 7.2 oz 168 lb 14 oz  Weight (kg) 76.2 kg 75.5 kg 76.6 kg      Telemetry/ECG    SR 80's - Personally Reviewed  Physical Exam .   GEN: No acute distress.   Neck: No JVD Cardiac: RRR, no murmurs, rubs, or gallops.  Respiratory: Clear to auscultation bilaterally. GI: Soft, nontender, non-distended  MS: No edema, cath site clean dry and intact  Assessment & Plan .     58 year old with right and left heart catheterization showing moderate multivessel coronary artery disease-medical management, no PCI performed.  Wedge pressure was 18 mildly elevated.  Takotsubo cardiomyopathy Type II myocardial infarction-troponin 3400, not plaque rupture or ACS. Acute systolic heart failure -Likely stress-induced after polysubstance abuse including cocaine -Agree with aspirin 81 mg, Plavix 75 mg, atorvastatin 40 mg, carvedilol 6.25 mg twice daily, Entresto 49/51 mg, isosorbide 30 mg a day, Jardiance 10 mg a day. -Lasix 40 mg daily as needed for edema is also very reasonable. -TOC CHF clinic for follow-up  AKI on CKD 3a, tobacco use, cocaine use, alcohol use, anemia - Continue modified risk  factors. -Creatinine 1.53 this morning (new baseline may be 1.3-1.5) -Repeat basic metabolic profile as an outpatient -Discussed with Dr. Lois Huxley with DC For questions or updates, please contact [facility] Please consult [email or web address] for contact info under        Signed, Donato Schultz, MD

## 2024-02-23 NOTE — Progress Notes (Signed)
 Heart Failure Nurse Navigator Progress Note    Navigator called and spoke with patients son Isaac Wilkerson [phone]) about purchasing a scale for patient at discharge for daily weights. Mr. Isaac Wilkerson stated he would buy one when he picks his dad up for discharge on 02/23/2024.    Rhae Hammock, BSN, Scientist, clinical (histocompatibility and immunogenetics) Only

## 2024-02-23 NOTE — TOC Transition Note (Addendum)
 Transition of Care [facility]) - Discharge Note   Patient Details  Name: Isaac Wilkerson MRN: [id number] Date of Birth: 1966-11-17  Transition [facility]) CM/SW Contact:  Leone Haven, RN Phone Number: 02/23/2024, 10:38 AM   Clinical Narrative:    For dc today, son will transport home.  Has zero co pay for entresto.         Patient Goals and CMS Choice            Discharge Placement                       Discharge Plan and Services Additional resources added to the After Visit Summary for                                       Social Drivers of Health (SDOH) Interventions SDOH Screenings   Food Insecurity: No Food Insecurity (02/19/2024)  Housing: Low Risk  (02/22/2024)  Recent Concern: Housing - High Risk (02/19/2024)  Transportation Needs: No Transportation Needs (02/22/2024)  Utilities: Not At Risk (02/19/2024)  Alcohol Screen: Medium Risk (02/22/2024)  Financial Resource Strain: Low Risk  (12/18/2023)   Received from [facility]  Tobacco Use: High Risk (02/19/2024)  Health Literacy: Medium Risk (12/18/2023)   Received from [facility]     Readmission Risk Interventions    02/23/2024   10:36 AM  Readmission Risk Prevention Plan  Post Dischage Appt Complete  Medication Screening Complete  Transportation Screening Complete

## 2024-02-23 NOTE — TOC CM/SW Note (Signed)
 Transition of Care [facility]) - Inpatient Brief Assessment   Patient Details  Name: Isaac Wilkerson MRN: [id number] Date of Birth: 01-03-1966  Transition of Care [facility]) CM/SW Contact:    Leone Haven, RN Phone Number: 02/23/2024, 10:38 AM   Clinical Narrative: From home with spouse, has PCP and insurance on file, states has no HH services in place at this time , has nebulizer at home.  States family member will transport them home at [employer] and family is support system, states gets medications from [facility].  Pta self ambulatory.   Transition of Care Asessment: Insurance and Status: Insurance coverage has been reviewed Patient has primary care physician: Yes Home environment has been reviewed: lives with wife in home Prior level of function:: indep Prior/Current Home Services: Current home services (nebulizer) Social Drivers of Health Review: SDOH reviewed no interventions necessary Readmission risk has been reviewed: Yes Transition of care needs: no transition of care needs at this time

## 2024-02-24 ENCOUNTER — Telehealth: Payer: Self-pay

## 2024-02-24 LAB — LIPOPROTEIN A (LPA): Lipoprotein (a): 200.2 nmol/L — ABNORMAL HIGH (ref <75.0)

## 2024-02-24 NOTE — Transitions of Care (Post Inpatient/ED Visit) (Signed)
 02/24/2024  Name: Isaac Wilkerson MRN: [id number] DOB: [birth year]  Today's TOC FU Call Status: Today's TOC FU Call Status:: Successful TOC FU Call Completed TOC FU Call Complete Date: 02/24/24 Patient's Name and Date of Birth confirmed.  Transition Care Management Follow-up Telephone Call Date of Discharge: 02/23/24 Discharge Facility: [facility]) Type of Discharge: Inpatient Admission Primary Inpatient Discharge Diagnosis:: NSTEMI  NSTEMI (non-ST elevated myocardial infarction) chronic combined CHF with recovered EF, polysubstance abuse including tobacco EtOH and cocaine there was concern for Cocaine induced vasospasm  How have you been since you were released from the hospital?: Better Any questions or concerns?: No  Items Reviewed: Did you receive and understand the discharge instructions provided?: Yes Medications obtained,verified, and reconciled?:  (Medication reconciliation completed based on recent discharge summary Patient taking medications as instructed and is aware of any changes or dosage adjustments medication regimen. Patient denies questions and reports no barriers to medication adherence) Any new allergies since your discharge?: No Dietary orders reviewed?: Yes Type of Diet Ordered:: Reg Heart Healthy Do you have support at home?: Yes People in Home: child(ren), adult Name of Support/Comfort Primary Source: Son [name], [name] and Mother -[name]  Medications Reviewed Today: Medications Reviewed Today     Reviewed by Johnnette Barrios, RN (Registered Nurse) on 02/24/24 at 1346  Med List Status: <None>   Medication Order Taking? Sig Documenting Provider Last Dose Status Informant  acetaminophen (TYLENOL) 500 MG tablet [id number] Yes Take 1,000 mg by mouth every 6 (six) hours as needed for mild pain (pain score 1-3) or moderate pain (pain score 4-6). [provider] Taking Active Self, Pharmacy Records  aspirin EC 81 MG tablet [id number] Yes Take 81 mg by  mouth daily. [provider] Taking Active Self, Pharmacy Records  atorvastatin (LIPITOR) 40 MG tablet [id number] Yes Take 1 tablet (40 mg total) by mouth daily. Zannie Cove, MD Taking Active   carvedilol (COREG) 6.25 MG tablet [id number] Yes Take 1 tablet (6.25 mg total) by mouth 2 (two) times daily with a meal. Zannie Cove, MD Taking Active   clopidogrel (PLAVIX) 75 MG tablet [id number] Yes Take 1 tablet (75 mg total) by mouth daily. Zannie Cove, MD Taking Active   empagliflozin (JARDIANCE) 10 MG TABS tablet [id number] Yes Take 1 tablet (10 mg total) by mouth daily. Zannie Cove, MD Taking Active   furosemide (LASIX) 40 MG tablet [id number] Yes Take 1 tablet (40 mg total) by mouth as needed (For increased swelling or weight gain of 3 LB in 1 day or 5 LB in 1 week). Zannie Cove, MD Taking Active   isosorbide mononitrate (IMDUR) 30 MG 24 hr tablet [id number] Yes Take 1 tablet (30 mg total) by mouth daily. Zannie Cove, MD Taking Active   sacubitril-valsartan [facility]) 49-51 MG [id number] Yes Take 1 tablet by mouth 2 (two) times daily. Zannie Cove, MD Taking Active           Medication reconciliation / review completed based on most recent discharge summary and EHR medication list. Confirmed patient is taking all newly prescribed medications as instructed (any discrepancies are noted in review section)   Patient / Caregiver is aware of any changes to and / or  any dosage adjustments to medication regimen. Patient/ Caregiver denies questions at this time and reports no barriers to medication adherence.    Home Care and Equipment/Supplies: Were Home Health Services Ordered?: No Any new equipment or medical supplies ordered?: No  Functional Questionnaire: Do you need assistance with bathing/showering  or dressing?: No Do you need assistance with meal preparation?: No Do you need assistance with eating?: No Do you have difficulty maintaining continence: No Do you need  assistance with getting out of bed/getting out of a chair/moving?: No (He just walked 2 miles) Do you have difficulty managing or taking your medications?: No  Follow up appointments reviewed: PCP Follow-up appointment confirmed?: No (He will call and schedule) MD Provider Line [phone] Given: No Specialist Hospital Follow-up appointment confirmed?: Yes Date of Specialist follow-up appointment?: 03/01/24 Follow-Up Specialty Provider:: HEART VASCULAR TOC NEW ([id number])    Provider: [facility] Do you need transportation to your follow-up appointment?: No (Son will transport) Do you understand care options if your condition(s) worsen?: Yes-patient verbalized understanding  SDOH Interventions Today    Flowsheet Row Most Recent Value  SDOH Interventions   Food Insecurity Interventions Intervention Not Indicated  Housing Interventions Intervention Not Indicated  Transportation Interventions Intervention Not Indicated, Patient Resources (Friends/Family), Payor Benefit  Utilities Interventions Intervention Not Indicated      Interventions Today    Flowsheet Row Most Recent Value  Chronic Disease   Chronic disease during today's visit Hypertension (HTN), Other  [NSTEMI]  General Interventions   General Interventions Discussed/Reviewed General Interventions Discussed, General Interventions Reviewed, Doctor Visits  Doctor Visits Discussed/Reviewed PCP, Specialist, Doctor Visits Discussed, Doctor Visits Reviewed  PCP/Specialist Visits Compliance with follow-up visit  Exercise Interventions   Exercise Discussed/Reviewed Exercise Discussed, Exercise Reviewed, Physical Activity, Weight Managment  [He is geting a new scale will follow-up with PCP and Insurance on purchasing]  Physical Activity Discussed/Reviewed Physical Activity Discussed, Physical Activity Reviewed, Types of exercise  Education Interventions   Education Provided Provided Education  Provided Verbal  Education On Nutrition, Mental Health/Coping with Illness, When to see the doctor, Medication, Exercise, Insurance Plans  Mental Health Interventions   Mental Health Discussed/Reviewed Coping Strategies, Substance Abuse  Nutrition Interventions   Nutrition Discussed/Reviewed Nutrition Reviewed, Nutrition Discussed, Decreasing salt, Decreasing fats  Pharmacy Interventions   Pharmacy Dicussed/Reviewed Pharmacy Topics Reviewed, Pharmacy Topics Discussed, Medication Adherence, Medications and their functions  Safety Interventions   Safety Discussed/Reviewed Safety Discussed, Safety Reviewed        Benefits reviewed  Based on current information and Insurance plan -Reviewed benefits accessible to patient, including details about eligibility options for care and  available value based care options  if any areas of needs were identified.  Reviewed patient/  caregiver's ability to access and / or  ability with navigating the benefits system..Amb Referral made if indicted , refer to orders section of note for details   Reviewed goals for care Patient/ Caregiver  verbalizes understanding of instructions and care plan provided. Patient / Caregiver was encouraged to make informed decisions about their care, actively participate in managing their health condition, and implement lifestyle changes as needed to promote independence and self-management of health care. There were no reported  barriers to care.   TOC program  Patient is at high risk for readmission and/or has history of  high utilization  Discussed VBCI  TOC program and weekly calls to patient to assess condition/status, medication management  and provide support/education as indicated . Patient/ Caregiver voiced understanding and declined enrollment in the 30-day [facility] Program.   He is doing well at home He just completed a 2 mile walk. He stated he has strong family support. He has his medications a follow-up visit with Cardiology and he will call  and schedule a visit with his PCP .  He is working on getting a scale to complete daily weights    The patient has been provided with contact information for the care management team and has been advised to call with any health-related questions or concerns. Follow up as indicated with Care Team , or sooner should any new problems arise.     Susa Loffler , BSN, RN [facility]   VBCI-Population Health RN Care Manager Direct Dial [phone]  Fax: [phone] Website: [email or web address]

## 2024-02-29 NOTE — Progress Notes (Signed)
 HEART & VASCULAR TRANSITION OF CARE CONSULT NOTE    Referring Physician: Toma Deiters, MD   Chief Complaint: Heart failure  HPI: Referred to clinic by Dr. Olena Leatherwood for heart failure consultation.   Isaac Wilkerson is a 58 y.o. male with combined systolic and diastolic heart failure, CAD and polysubstance abuse (ETOH, tobacco, cocaine).  He recently presented to [facility] with increased SOB. In the ED he was hypertensive and EKG showed T wave inversion in lateral leads and LVH. Trops were initially elevated at 10>11K. He was transferred to [facility] for further cardiology eval. He underwent a R/LHC which showed: RA 3, PA 46/23 (31), PCWP 18 and Fick CO/CI 3.9/2.1. Moderate multivessel coronary artery disease, including 50% D1, 40-50% proximal/mid LCx, and 60% proximal RCA lesions. No critical stenosis to explain elevated trops on admission. Suspected Takotsubo CM. Diuresed and started on GDMT.   Today he presents for AHF [facility] clinic visit. Overall feeling good. Denies palpitations, CP, dizziness, edema, or PND/Orthopnea. No SOB. Appetite great he eats a lot. Tries to eat mostly baked food. Eats a lot of bacon in the morning. No fever or chills. Weight at home 166-174 pounds. Taking all medications. Denies ETOH, tobacco or drug use. Has been cocaine free since discharge. Walks 2-3 miles a day and drinks a lot of water and juice after. Drinks 2 beers a day. Smokes 2-3 cigarettes a day. Father passed away from MI. His brother is in prison now but also had something "wrong in his heart". Mom had an MI. Older brother passed away many years ago but had open heart surgery, unsure from what.    Cardiac Testing  Echo 3/25:  EF 30-35%, LV with GHK, GIIIDD, RV mod reduced. LA/RA severely dilated. Mod-severe MR  RHC 3/25 RA (mean): 3 mmHg RV (S/EDP): 46/10 mmHg PA (S/D, mean): 46/23 (31) mmHg PCWP (mean): 18 mmHg Ao sat: 93% PA sat: 54% Fick CO: 3.9 L/min Fick CI: 2.1 L/min/m^2 PVR: 2.1 Wood units    LHC 3/25: Moderate multivessel coronary artery disease, including 50% D1, 40-50% proximal/mid LCx, and 60% proximal RCA lesions. No critical stenosis to explain elevated trops on admission. >Suspected Takotsubo CM.   Past Medical History:  Diagnosis Date   Alcohol abuse    CAD (coronary artery disease)    Chronic combined systolic and diastolic CHF (congestive heart failure) (HCC)    Cocaine use    Hypertension     Current Outpatient Medications  Medication Sig Dispense Refill   acetaminophen (TYLENOL) 500 MG tablet Take 1,000 mg by mouth every 6 (six) hours as needed for mild pain (pain score 1-3) or moderate pain (pain score 4-6).     aspirin EC 81 MG tablet Take 81 mg by mouth daily.     clopidogrel (PLAVIX) 75 MG tablet Take 1 tablet (75 mg total) by mouth daily. 30 tablet 1   isosorbide mononitrate (IMDUR) 30 MG 24 hr tablet Take 1 tablet (30 mg total) by mouth daily. 30 tablet 0   spironolactone (ALDACTONE) 25 MG tablet Take 0.5 tablets (12.5 mg total) by mouth daily. 45 tablet 3   atorvastatin (LIPITOR) 40 MG tablet Take 1 tablet (40 mg total) by mouth daily. 30 tablet 1   carvedilol (COREG) 6.25 MG tablet Take 1 tablet (6.25 mg total) by mouth 2 (two) times daily with a meal. 60 tablet 1   empagliflozin (JARDIANCE) 10 MG TABS tablet Take 1 tablet (10 mg total) by mouth daily. 30 tablet 1  furosemide (LASIX) 40 MG tablet Take 1 tablet (40 mg total) by mouth daily. 30 tablet 3   sacubitril-valsartan (ENTRESTO) 49-51 MG Take 1 tablet by mouth 2 (two) times daily. 60 tablet 1   No current facility-administered medications for this encounter.   No Known Allergies   Social History   Socioeconomic History   Marital status: Single    Spouse name: Not on file   Number of children: 3   Years of education: Not on file   Highest education level: 7th grade  Occupational History   Occupation: cook  Tobacco Use   Smoking status: Every Day    Current packs/day: 0.00    Average  packs/day: 0.5 packs/day for 40.0 years (20.0 ttl pk-yrs)    Types: Cigarettes    Start date: 10/12/1976    Last attempt to quit: 10/12/2016    Years since quitting: 7.3   Smokeless tobacco: Never   Tobacco comments:    Only smokes when he drinks  Vaping Use   Vaping status: Never Used  Substance and Sexual Activity   Alcohol use: Yes    Alcohol/week: 24.0 standard drinks of alcohol    Types: 24 Cans of beer per week    Comment: 24 beers per day   Drug use: Yes    Types: Cocaine, Marijuana    Comment: last used 1 week   Sexual activity: Not on file  Other Topics Concern   Not on file  Social History Narrative   Not on file   Social Drivers of Health   Financial Resource Strain: Low Risk  (12/18/2023)   Received from [facility]   Overall Financial Resource Strain (CARDIA)    Difficulty of Paying Living Expenses: Not very hard  Food Insecurity: No Food Insecurity (02/24/2024)   Hunger Vital Sign    Worried About Running Out of Food in the Last Year: Never true    Ran Out of Food in the Last Year: Never true  Transportation Needs: No Transportation Needs (02/24/2024)   PRAPARE - Administrator, Civil Service (Medical): No    Lack of Transportation (Non-Medical): No  Physical Activity: Not on file  Stress: Not on file  Social Connections: Not on file  Intimate Partner Violence: Not At Risk (02/24/2024)   Humiliation, Afraid, Rape, and Kick questionnaire    Fear of Current or Ex-Partner: No    Emotionally Abused: No    Physically Abused: No    Sexually Abused: No   Family History  Problem Relation Age of Onset   Congestive Heart Failure Father    CAD Father 37   Cardiomyopathy Brother    CAD Brother 41   CAD Mother 27   Vitals:   03/01/24 1534  BP: 123/78  Pulse: 78  SpO2: 98%  Weight: 79.3 kg (174 lb 12.8 oz)  Height: 5\' 6"  (1.676 m)    PHYSICAL EXAM: General:  well appearing.  No respiratory difficulty. Walked into clinic HEENT: normal Neck:  supple. JVD ~10 cm.  Cor: PMI nondisplaced. Regular rate & rhythm. No rubs, gallops or murmurs. Lungs: clear, diminished bases Extremities: no cyanosis, clubbing, rash, trace BLE edema  Neuro: alert & oriented x 3. Moves all 4 extremities w/o difficulty. Affect pleasant.   Wt Readings from Last 3 Encounters:  03/01/24 79.3 kg (174 lb 12.8 oz)  02/23/24 76.2 kg (167 lb 15.9 oz)  05/30/20 98.2 kg (216 lb 9.6 oz)    ECG: NSR 81 bpm (Personally reviewed)  ReDs reading: 44 %, abnormal   ASSESSMENT & PLAN: Chronic systolic heart failure - recent exacerbation suspected takotsubo etiology? With recent cocaine use.  - Echo EF 30-35%, LV with GHK, GIIIDD, RV mod reduced. LA/RA severely dilated. Mod-severe MR NYHA II - Volume overloaded by ReDS clip. Difficult to gauge on assessment but he admits to eating high salty foods and constantly drinking fluid. He has noticed his weight go up.  GDMT  Diuretic- Change Lasix 40 mg PRN> daily  BB- Continue coreg 6.25. Would stop if he starts cocaine again.  Ace/ARB/ARNI - Continue Entresto 49-51 mg BID MRA Start spiro 12.5 mg daily. BMET/BNP today. Repeat BMET 7 days.  SGLT2i - Continue Jardiance 10 mg daily - Would repeat echo in ~3 months.  - Not a candidate for advanced therapies with substance abuse.  - Discussed diet and fluid intake.   CAD, recent Type II MI - LHC 3/25: Moderate multivessel coronary artery disease, including 50% D1, 40-50% proximal/mid LCx, and 60% proximal RCA lesions. No critical stenosis to explain elevated trops on recent admission - Continue Plavix, statin and ASA - Continue Imdur 30 mg daily - Denies CP - LDL 91 3/25  HTN - BP mildly elevated today - Continue current regimen  CKD 3a - Labs today  - SCr last 1.53  Cocaine / tobacco / ETOH use - Seems motivated to not do cocaine again. (Has not done since discharged) - Drinks 2 beers a day, also discussed cutting back.  - Smokes 2-3 cigarettes a day, showed him  images of his heart cath today and he was impressed/upset about his blockages. Also seems motivated to quit.   Currently in court for probation violation, asked for paperwork and told him he would get a copy of his AVS to present if needed. Provided patient with a scale, pill box and pill cutter today. Updated his son via phone and reviewed what we discussed today in clinic per pt request.   Referred to HFSW (PCP, Medications, Transportation, ETOH Abuse, Drug Abuse, Insurance, Financial ): Yes (Needs PCP, unable to do at this visit as no SW today, plan to address at next visit)  Refer to Pharmacy: No Refer to Home Health:  No Refer to Advanced [facility] Failure Clinic: No  Refer to General Cardiology: Yes  Follow up in 2-3 weeks to reevaluate fluid and try to increase Spiro/Entresto if renal function permits.

## 2024-03-01 ENCOUNTER — Encounter (HOSPITAL_COMMUNITY): Payer: Self-pay

## 2024-03-01 ENCOUNTER — Ambulatory Visit (HOSPITAL_COMMUNITY)
Admission: RE | Admit: 2024-03-01 | Discharge: 2024-03-01 | Disposition: A | Discharge status: Home / Self Care | Source: Ambulatory Visit | Attending: Internal Medicine | Admitting: Internal Medicine

## 2024-03-01 VITALS — BP 123/78 | HR 78 | Ht 66.0 in | Wt 174.8 lb

## 2024-03-01 DIAGNOSIS — I130 Hypertensive heart and chronic kidney disease with heart failure and stage 1 through stage 4 chronic kidney disease, or unspecified chronic kidney disease: Secondary | ICD-10-CM | POA: Insufficient documentation

## 2024-03-01 DIAGNOSIS — Z7984 Long term (current) use of oral hypoglycemic drugs: Secondary | ICD-10-CM | POA: Diagnosis not present

## 2024-03-01 DIAGNOSIS — I5022 Chronic systolic (congestive) heart failure: Secondary | ICD-10-CM | POA: Diagnosis not present

## 2024-03-01 DIAGNOSIS — Z7982 Long term (current) use of aspirin: Secondary | ICD-10-CM | POA: Diagnosis not present

## 2024-03-01 DIAGNOSIS — N1831 Chronic kidney disease, stage 3a: Secondary | ICD-10-CM | POA: Diagnosis not present

## 2024-03-01 DIAGNOSIS — F1010 Alcohol abuse, uncomplicated: Secondary | ICD-10-CM | POA: Diagnosis not present

## 2024-03-01 DIAGNOSIS — I252 Old myocardial infarction: Secondary | ICD-10-CM | POA: Diagnosis not present

## 2024-03-01 DIAGNOSIS — F1490 Cocaine use, unspecified, uncomplicated: Secondary | ICD-10-CM

## 2024-03-01 DIAGNOSIS — Z8249 Family history of ischemic heart disease and other diseases of the circulatory system: Secondary | ICD-10-CM | POA: Diagnosis not present

## 2024-03-01 DIAGNOSIS — I5042 Chronic combined systolic (congestive) and diastolic (congestive) heart failure: Secondary | ICD-10-CM | POA: Diagnosis not present

## 2024-03-01 DIAGNOSIS — Z720 Tobacco use: Secondary | ICD-10-CM | POA: Diagnosis not present

## 2024-03-01 DIAGNOSIS — I2510 Atherosclerotic heart disease of native coronary artery without angina pectoris: Secondary | ICD-10-CM | POA: Diagnosis not present

## 2024-03-01 DIAGNOSIS — Z7902 Long term (current) use of antithrombotics/antiplatelets: Secondary | ICD-10-CM | POA: Diagnosis not present

## 2024-03-01 DIAGNOSIS — F1090 Alcohol use, unspecified, uncomplicated: Secondary | ICD-10-CM | POA: Diagnosis not present

## 2024-03-01 DIAGNOSIS — Z79899 Other long term (current) drug therapy: Secondary | ICD-10-CM | POA: Diagnosis not present

## 2024-03-01 DIAGNOSIS — I10 Essential (primary) hypertension: Secondary | ICD-10-CM | POA: Diagnosis not present

## 2024-03-01 DIAGNOSIS — F17210 Nicotine dependence, cigarettes, uncomplicated: Secondary | ICD-10-CM | POA: Insufficient documentation

## 2024-03-01 LAB — BASIC METABOLIC PANEL
Anion gap: 7 (ref 5–15)
BUN: 20 mg/dL (ref 6–20)
CO2: 23 mmol/L (ref 22–32)
Calcium: 8.6 mg/dL — ABNORMAL LOW (ref 8.9–10.3)
Chloride: 108 mmol/L (ref 98–111)
Creatinine, Ser: 1.58 mg/dL — ABNORMAL HIGH (ref 0.61–1.24)
GFR, Estimated: 50 mL/min — ABNORMAL LOW (ref >60)
Glucose, Bld: 81 mg/dL (ref 70–99)
Potassium: 4.3 mmol/L (ref 3.5–5.1)
Sodium: 138 mmol/L (ref 135–145)

## 2024-03-01 LAB — BRAIN NATRIURETIC PEPTIDE: B Natriuretic Peptide: 589.1 pg/mL — ABNORMAL HIGH (ref 0.0–100.0)

## 2024-03-01 MED ORDER — SPIRONOLACTONE 25 MG PO TABS: 12.5000 mg | ORAL_TABLET | Freq: Every day | ORAL | 3 refills | Status: DC

## 2024-03-01 MED ORDER — FUROSEMIDE 40 MG PO TABS: 40.0000 mg | ORAL_TABLET | Freq: Every day | ORAL | 3 refills | Status: DC

## 2024-03-01 MED ORDER — SACUBITRIL-VALSARTAN 49-51 MG PO TABS: 1.0000 | ORAL_TABLET | Freq: Two times a day (BID) | ORAL | 1 refills | Status: DC

## 2024-03-01 MED ORDER — ATORVASTATIN CALCIUM 40 MG PO TABS: 40.0000 mg | ORAL_TABLET | Freq: Every day | ORAL | 1 refills | Status: DC

## 2024-03-01 MED ORDER — CARVEDILOL 6.25 MG PO TABS: 6.2500 mg | ORAL_TABLET | Freq: Two times a day (BID) | ORAL | 1 refills | Status: DC

## 2024-03-01 MED ORDER — EMPAGLIFLOZIN 10 MG PO TABS: 10.0000 mg | ORAL_TABLET | Freq: Every day | ORAL | 1 refills | Status: DC

## 2024-03-01 NOTE — Progress Notes (Signed)
 ReDS Vest / Clip - 03/01/24 1534       ReDS Vest / Clip   Station Marker C    Ruler Value 28    ReDS Value Range High volume overload    ReDS Actual Value 44

## 2024-03-01 NOTE — Patient Instructions (Signed)
 Medication Changes:  START:  LASIX (FUROSEMIDE) 40MG  ONCE DAILY   START: SPIRONOLACTONE 12.5MG  ONCE DAILY   HEART FAILURE MEDICATIONS REFILLED TO PHARMACY   Lab Work:  Labs done today, your results will be available in MyChart, we will contact you for abnormal readings.  THEN LABS AGAIN IN 7-10 DAYS AS SCHEDULED   Referrals:  YOU HAVE BEEN REFERRED TO GENERAL CARDIOLOGY  THEY WILL REACH OUT TO YOU OR CALL TO ARRANGE THIS. PLEASE CALL us WITH ANY CONCERNS   Follow-Up in: 2 WEEKS WITH TOC CLINIC AS SCHEDULED   At the [facility], you and your health needs are our priority. We have a designated team specialized in the treatment of Heart Failure. This Care Team includes your primary Heart Failure Specialized Cardiologist (physician), Advanced Practice Providers (APPs- Physician Assistants and Nurse Practitioners), and Pharmacist who all work together to provide you with the care you need, when you need it.   You may see any of the following providers on your designated Care Team at your next follow up:  Dr. Arvilla Meres Dr. Marca Ancona Dr. Dorthula Nettles Dr. Theresia Bough Tonye Becket, NP Robbie Lis, [place] [facility] Ger[place], [place] Brynda Peon, NP [place] Lee, NP Karle Plumber, PharmD   Please be sure to bring in all your medications bottles to every appointment.   Need to Contact us:  If you have any questions or concerns before your next appointment please send [place] a message through [place] or call our office at [phone].    TO LEAVE A MESSAGE FOR THE NURSE SELECT OPTION 2, PLEASE LEAVE A MESSAGE INCLUDING: YOUR NAME DATE OF BIRTH CALL BACK NUMBER REASON FOR CALL**this is important as we prioritize the call backs  YOU WILL RECEIVE A CALL BACK THE SAME DAY AS LONG AS YOU CALL BEFORE 4:00 PM

## 2024-03-02 ENCOUNTER — Other Ambulatory Visit: Payer: Self-pay | Admitting: Cardiology

## 2024-03-06 NOTE — Discharge Summary (Signed)
 Physician Discharge Summary  Zade Falkner MWU:[id number] DOB: [birth year] DOA: 02/19/2024  PCP: Toma Deiters, MD  Admit date: 02/19/2024 Discharge date: 02/23/2024  Time spent:  Recommendations for Outpatient Follow-up:  CHF TOC clinic on 3/11, please check BMP at follow-up   Discharge Diagnoses:  Principal Problem: Acute on chronic combined CHF NSTEMI   Essential hypertension   Chronic combined systolic (congestive) and diastolic (congestive) heart failure (HCC)   Tobacco abuse   Polysubstance abuse (HCC)   Alcohol abuse   Type 2 MI (myocardial infarction) (HCC) secondary to Takotsubo   Takotsubo cardiomyopathy   Discharge Condition: Improved  Diet recommendation: Diabetic, heart healthy  Filed Weights   02/21/24 0443 02/22/24 0500 02/23/24 0403  Weight: 76.6 kg 75.5 kg 76.2 kg    History of present illness:   58/M with chronic combined CHF with recovered EF, polysubstance abuse including tobacco EtOH and cocaine, nonobstructive CAD presented to ED at [facility] with worsening shortness of breath.  Patient reports some progressive dyspnea over 2 to 3 days, acutely worsened with some orthopnea.  He denied any chest pain. -ED Course: Hypertensive, EKG with LVH, T wave inversion in lateral leads, initial troponin was 10K, peaked at 11K and then started trending down, chest x-ray suggestive of CHF.  Transferred to [facility] for cardiology eval -Admitted, started on diuretics -LHC 3/3 with moderate nonobstructive CAD, mildly elevated filling pressures  Hospital Course:    NSTEMI (non-ST elevated myocardial infarction) (HCC) -HS troponin peaked at 11K at [facility] R,  -Cardiology following, R/LHC with moderate nonobstructive CAD --Continue aspirin, Plavix, statin, Imdur, started Coreg -Cards suspect Takotsubo's cardiomyopathy   Acute on chronic combined CHF -EF was down to 25-30% in 4/27, subsequently recovered to 65-70% in late 2017 and 2019 -Repeat echo with EF down to  30-35%, global hypokinesis, grade 3 DD -R/LHC with moderate nonobstructive CAD, mildly elevated filling pressures -improved with diuresis, followed by cardiology, started on Entresto, Jardiance, carvedilol, PRN lasix -Improved and stable, needs labs in 1 week   Essential hypertension -Continue Imdur, discontinued amlodipine -Diuretics as noted above   Tobacco abuse   Polysubstance abuse (HCC)   Alcohol abuse -Counseled   Mild AKI,  -Mild, cardiorenal, plateauing   Suspected COPD -Poor air movement, no wheezing   Discharge Exam: Vitals:   02/23/24 0730 02/23/24 0754  BP: (!) 159/100 (!) 159/100  Pulse: 88 82  Resp: 18   Temp: (!) 97.5 F (36.4 C)   SpO2: 97%     Gen: Awake, Alert, Oriented X 3,  HEENT: + JVD Lungs: Good air movement bilaterally, CTAB CVS: S1S2/RRR Abd: soft, Non tender, non distended, BS present Extremities: No edema Skin: no new rashes on exposed skin   Discharge Instructions   Discharge Instructions     Amb Referral to Cardiac Rehabilitation   Complete by: As directed    Diagnosis: NSTEMI   After initial evaluation and assessments completed: Virtual Based Care may be provided alone or in conjunction with Phase 2 Cardiac Rehab based on patient barriers.: Yes   Intensive Cardiac Rehabilitation (ICR) [facility] location only OR Traditional Cardiac Rehabilitation (TCR) *If criteria for ICR are not met will enroll in TCR [facility] only): Yes   Diet - low sodium heart healthy   Complete by: As directed    Increase activity slowly   Complete by: As directed       Allergies as of 02/23/2024   No Known Allergies      No Known Allergies  Follow-up Information  [facility]. Go in 8 day(s).   Specialty: Cardiology Why: Hospital follow up 03/01/2024 @ 3:30 pm PLEASE bring a current medication list to appointment FREE valet parking, Entrance C, off [employer] information: [address]  [place] [address] [phone]        Toma Deiters, MD Follow up.   Specialty: Internal Medicine Why: Please follow up in a week. Contact information: [address] [place] [address] [phone]                  The results of significant diagnostics from this hospitalization (including imaging, microbiology, ancillary and laboratory) are listed below for reference.    Significant Diagnostic Studies: CARDIAC CATHETERIZATION Result Date: 02/22/2024   Prox RCA lesion is 60% stenosed.   Lat 1st Diag lesion is 50% stenosed.   Prox Cx to Mid Cx lesion is 45% stenosed.   LV end diastolic pressure is mildly elevated.   There is no aortic valve stenosis.   Anticipated discharge date to be determined.   Recommend uninterrupted dual antiplatelet therapy with Aspirin 81mg  daily and Clopidogrel 75mg  daily. Conclusions: Moderate multivessel coronary artery disease, including 50% D1, 40-50% proximal/mid LCx, and 60% proximal RCA lesions.  No critical stenosis identified to explain the patient's elevated troponin.  Question if this represents stress-induced cardiomyopathy in the setting of reduced LVEF and recent stressor (mother's stroke). Upper normal to mildly elevated left and right heart filling pressures. Mild pulmonary hypertension. Mildly reduced Fick cardiac output/index. Recommendations: Aggressive secondary prevention of coronary artery disease; I will add clopidogrel with plans for 12 months of DAPT in the setting of medically managed MINOCA.  Switch pravastatin to atorvastatin as well. Hold aggressive diuresis and add low-dose carvedilol and losartan with escalation of GDMT as tolerated. Yvonne Kendall, MD [facility]  ECHOCARDIOGRAM COMPLETE Result Date: 02/20/2024    ECHOCARDIOGRAM REPORT   Patient Name:   Isaac Wilkerson Date of Exam: 02/20/2024 Medical Rec #:  [id number]      Height:       66.0 in Accession #:    [id number]     Weight:       168.0 lb  Date of Birth:  [birth year]       BSA:          1.857 m Patient Age:    58 years       BP:           141/94 mmHg Patient Gender: M              HR:           9 bpm. Exam Location:  Inpatient Procedure: 2D Echo, Cardiac Doppler, Color Doppler and Intracardiac            Opacification Agent (Both Spectral and Color Flow Doppler were            utilized during procedure). Indications:    CHF- Acute Diastolic I50.31  History:        Patient has prior history of Echocardiogram examinations, most                 recent 10/08/2018. CHF, Previous Myocardial Infarction; Risk                 Factors:Hypertension and Current Smoker.  Sonographer:    Lucendia Herrlich RCS Referring Phys: [phone] Hoy Fallert IMPRESSIONS  1. No apical thrombus with Definity contrast. Left ventricular  ejection fraction, by estimation, is 30 to 35%. Left ventricular ejection fraction by 2D MOD biplane is 31.1 %. The left ventricle has moderately decreased function. The left ventricle demonstrates global hypokinesis. There is mild left ventricular hypertrophy. Left ventricular diastolic parameters are consistent with Grade III diastolic dysfunction (restrictive). Elevated left ventricular end-diastolic pressure. The E/e' is 24.  2. Right ventricular systolic function is moderately reduced. The right ventricular size is moderately enlarged. There is severely elevated pulmonary artery systolic pressure. The estimated right ventricular systolic pressure is 67.1 mmHg.  3. Left atrial size was severely dilated.  4. Right atrial size was severely dilated.  5. The mitral valve is abnormal. Moderate to severe mitral valve regurgitation.  6. The aortic valve is tricuspid. Aortic valve regurgitation is not visualized.  7. The inferior vena cava is dilated in size with <50% respiratory variability, suggesting right atrial pressure of 15 mmHg. Comparison(s): Prior images unable to be directly viewed, comparison made by report only. Changes from prior study are  noted. 10/08/2018: LVEF 55-60%. FINDINGS  Left Ventricle: No apical thrombus with Definity contrast. Left ventricular ejection fraction, by estimation, is 30 to 35%. Left ventricular ejection fraction by 2D MOD biplane is 31.1 %. The left ventricle has moderately decreased function. The left ventricle demonstrates global hypokinesis. Definity contrast agent was given IV to delineate the left ventricular endocardial borders. Strain imaging was not performed. The left ventricular internal cavity size was normal in size. There is mild left ventricular hypertrophy. Left ventricular diastolic parameters are consistent with Grade III diastolic dysfunction (restrictive). Elevated left ventricular end-diastolic pressure. The E/e' is 24. Right Ventricle: The right ventricular size is moderately enlarged. No increase in right ventricular wall thickness. Right ventricular systolic function is moderately reduced. There is severely elevated pulmonary artery systolic pressure. The tricuspid regurgitant velocity is 3.61 m/s, and with an assumed right atrial pressure of 15 mmHg, the estimated right ventricular systolic pressure is 67.1 mmHg. Left Atrium: Left atrial size was severely dilated. Right Atrium: Right atrial size was severely dilated. Pericardium: There is no evidence of pericardial effusion. Mitral Valve: The mitral valve is abnormal. There is mild calcification of the anterior and posterior mitral valve leaflet(s). Moderate to severe mitral valve regurgitation, with eccentric laterally directed jet. Tricuspid Valve: The tricuspid valve is grossly normal. Tricuspid valve regurgitation is mild. Aortic Valve: The aortic valve is tricuspid. Aortic valve regurgitation is not visualized. Aortic valve peak gradient measures 5.3 mmHg. Pulmonic Valve: The pulmonic valve was grossly normal. Pulmonic valve regurgitation is trivial. Aorta: The aortic root and ascending aorta are structurally normal, with no evidence of dilitation.  Venous: The inferior vena cava is dilated in size with less than 50% respiratory variability, suggesting right atrial pressure of 15 mmHg. IAS/Shunts: No atrial level shunt detected by color flow Doppler. Additional Comments: 3D imaging was not performed.  LEFT VENTRICLE PLAX 2D                        Biplane EF (MOD) LVIDd:         5.90 cm         LV Biplane EF:   Left LVIDs:         5.00 cm                          ventricular LV PW:         1.20 cm  ejection LV IVS:        1.20 cm                          fraction by LVOT diam:     2.20 cm                          2D MOD LV SV:         59                               biplane is LV SV Index:   32                               31.1 %. LVOT Area:     3.80 cm                                Diastology                                LV e' medial:    4.90 cm/s LV Volumes (MOD)               LV E/e' medial:  24.1 LV vol d, MOD    204.0 ml      LV e' lateral:   10.00 cm/s A2C:                           LV E/e' lateral: 11.8 LV vol d, MOD    263.0 ml A4C: LV vol s, MOD    154.0 ml A2C: LV vol s, MOD    170.0 ml A4C: LV SV MOD A2C:   50.0 ml LV SV MOD A4C:   263.0 ml LV SV MOD BP:    74.6 ml RIGHT VENTRICLE            IVC RV S prime:     6.96 cm/s  IVC diam: 2.30 cm TAPSE (M-mode): 1.3 cm LEFT ATRIUM             Index        RIGHT ATRIUM           Index LA diam:        4.90 cm 2.64 cm/m   RA Area:     24.60 cm LA Vol (A2C):   81.4 ml 43.84 ml/m  RA Volume:   92.50 ml  49.81 ml/m LA Vol (A4C):   87.4 ml 47.07 ml/m LA Biplane Vol: 88.9 ml 47.87 ml/m  AORTIC VALVE AV Area (Vmax): 3.50 cm AV Vmax:        115.00 cm/s AV Peak Grad:   5.3 mmHg LVOT Vmax:      106.00 cm/s LVOT Vmean:     66.067 cm/s LVOT VTI:       0.154 m  AORTA Ao Root diam: 3.20 cm Ao Asc diam:  3.60 cm MITRAL VALVE                TRICUSPID VALVE MV Area (PHT): 6.65 cm     TR Peak grad:   52.1 mmHg MV Decel Time: 114 msec     TR Vmax:  361.00 cm/s MR Peak grad: 93.5 mmHg  MR Vmax:      483.50 cm/s   SHUNTS MV E velocity: 118.00 cm/s  Systemic VTI:  0.15 m MV A velocity: 56.80 cm/s   Systemic Diam: 2.20 cm MV E/A ratio:  2.08 Zoila Shutter MD Electronically signed by Zoila Shutter MD Signature Date/Time: 02/20/2024/9:28:24 AM    Final     Microbiology: No results found for this or any previous visit (from the past 240 hours).   Labs: Basic Metabolic Panel: Recent Labs  Lab 03/01/24 1624  NA 138  K 4.3  CL 108  CO2 23  GLUCOSE 81  BUN 20  CREATININE 1.58*  CALCIUM 8.6*   Liver Function Tests: No results for input(s): "AST", "ALT", "ALKPHOS", "BILITOT", "PROT", "ALBUMIN" in the last 168 hours. No results for input(s): "LIPASE", "AMYLASE" in the last 168 hours. No results for input(s): "AMMONIA" in the last 168 hours. CBC: No results for input(s): "WBC", "NEUTROABS", "HGB", "HCT", "MCV", "PLT" in the last 168 hours. Cardiac Enzymes: No results for input(s): "CKTOTAL", "CKMB", "CKMBINDEX", "TROPONINI" in the last 168 hours. BNP: BNP (last 3 results) Recent Labs    03/01/24 1624  BNP 589.1*    ProBNP (last 3 results) No results for input(s): "PROBNP" in the last 8760 hours.  CBG: No results for input(s): "GLUCAP" in the last 168 hours.     Signed:  Zannie Cove MD.  [facility] 03/06/2024, 12:21 PM

## 2024-03-10 ENCOUNTER — Other Ambulatory Visit (HOSPITAL_COMMUNITY)

## 2024-03-18 ENCOUNTER — Telehealth (HOSPITAL_COMMUNITY): Payer: Self-pay

## 2024-03-18 NOTE — Telephone Encounter (Signed)
 Called to confirm/remind patient of their appointment at the [facility] on  03/21/2024 1:30.   Appointment:   [] Confirmed  [] Left mess   [] No answer/No voice mail  [x] Phone not in service  Patient reminded to bring all medications and/or complete list.  Confirmed patient has transportation. Gave directions, instructed to utilize valet parking.

## 2024-03-21 ENCOUNTER — Ambulatory Visit (HOSPITAL_COMMUNITY)

## 2024-04-07 ENCOUNTER — Telehealth (HOSPITAL_COMMUNITY): Payer: Self-pay

## 2024-04-07 NOTE — Telephone Encounter (Signed)
 Called to confirm/remind patient of their appointment at the [facility] on 04/08/2024 8:15.   Appointment:   [] Confirmed  [] Left mess   [x] No answer/No voice mail  [] Phone not in service  Patient reminded to bring all medications and/or complete list.  Confirmed patient has transportation. Gave directions, instructed to utilize valet parking.

## 2024-04-08 ENCOUNTER — Ambulatory Visit (HOSPITAL_COMMUNITY)

## 2024-04-11 ENCOUNTER — Other Ambulatory Visit (HOSPITAL_COMMUNITY): Payer: Self-pay

## 2024-04-11 ENCOUNTER — Other Ambulatory Visit (HOSPITAL_COMMUNITY): Payer: Self-pay | Admitting: Cardiology

## 2024-04-11 MED ORDER — CARVEDILOL 6.25 MG PO TABS: 6.2500 mg | ORAL_TABLET | Freq: Two times a day (BID) | ORAL | 1 refills | Status: DC

## 2024-04-11 MED ORDER — ISOSORBIDE MONONITRATE ER 30 MG PO TB24: 30.0000 mg | ORAL_TABLET | Freq: Every day | ORAL | 1 refills | Status: DC

## 2024-04-11 MED ORDER — EMPAGLIFLOZIN 10 MG PO TABS: 10.0000 mg | ORAL_TABLET | Freq: Every day | ORAL | 1 refills | Status: DC

## 2024-04-11 MED ORDER — ATORVASTATIN CALCIUM 40 MG PO TABS: 40.0000 mg | ORAL_TABLET | Freq: Every day | ORAL | 1 refills | Status: DC

## 2024-04-11 MED ORDER — FUROSEMIDE 40 MG PO TABS: 40.0000 mg | ORAL_TABLET | Freq: Every day | ORAL | 1 refills | Status: DC

## 2024-04-11 MED ORDER — SPIRONOLACTONE 25 MG PO TABS: 12.5000 mg | ORAL_TABLET | Freq: Every day | ORAL | 1 refills | Status: DC

## 2024-04-11 MED ORDER — CLOPIDOGREL BISULFATE 75 MG PO TABS: 75.0000 mg | ORAL_TABLET | Freq: Every day | ORAL | 1 refills | Status: DC

## 2024-04-11 NOTE — Telephone Encounter (Signed)
 PATIENT CALLED TO REPORT HE IS NO LONGER  STAYING AT HOTEL [facility]IA [facility]-DURING CHECK OUT PROCESS BELONGINGS WERE TOSSED INCLUDING MEDICATIONS  REPORTS HE IS CURRENTLY IN [facility] IN [place]- NEEDS MEDS SENT TO PHARMACY   MEDS SENT-EDUCATED ON PROCESS OF EARLY FILL AND OR OUT OF POCKET COSTS

## 2024-04-12 ENCOUNTER — Other Ambulatory Visit: Payer: Self-pay

## 2024-04-12 ENCOUNTER — Emergency Department (HOSPITAL_COMMUNITY)

## 2024-04-12 ENCOUNTER — Encounter (HOSPITAL_COMMUNITY): Payer: Self-pay

## 2024-04-12 ENCOUNTER — Emergency Department (HOSPITAL_COMMUNITY)
Admission: EM | Admit: 2024-04-12 | Discharge: 2024-04-12 | Disposition: A | Discharge status: Home / Self Care | Attending: Emergency Medicine | Admitting: Emergency Medicine

## 2024-04-12 DIAGNOSIS — Z7901 Long term (current) use of anticoagulants: Secondary | ICD-10-CM | POA: Insufficient documentation

## 2024-04-12 DIAGNOSIS — Z760 Encounter for issue of repeat prescription: Secondary | ICD-10-CM | POA: Insufficient documentation

## 2024-04-12 DIAGNOSIS — Z7982 Long term (current) use of aspirin: Secondary | ICD-10-CM | POA: Diagnosis not present

## 2024-04-12 DIAGNOSIS — R918 Other nonspecific abnormal finding of lung field: Secondary | ICD-10-CM | POA: Diagnosis not present

## 2024-04-12 DIAGNOSIS — R0600 Dyspnea, unspecified: Secondary | ICD-10-CM | POA: Insufficient documentation

## 2024-04-12 DIAGNOSIS — R0602 Shortness of breath: Secondary | ICD-10-CM | POA: Diagnosis not present

## 2024-04-12 DIAGNOSIS — R079 Chest pain, unspecified: Secondary | ICD-10-CM | POA: Diagnosis not present

## 2024-04-12 LAB — CBC WITH DIFFERENTIAL/PLATELET
Abs Immature Granulocytes: 0.02 10*3/uL (ref 0.00–0.07)
Basophils Absolute: 0 10*3/uL (ref 0.0–0.1)
Basophils Relative: 1 %
Eosinophils Absolute: 0.1 10*3/uL (ref 0.0–0.5)
Eosinophils Relative: 2 %
HCT: 38.6 % — ABNORMAL LOW (ref 39.0–52.0)
Hemoglobin: 12 g/dL — ABNORMAL LOW (ref 13.0–17.0)
Immature Granulocytes: 0 %
Lymphocytes Relative: 27 %
Lymphs Abs: 1.6 10*3/uL (ref 0.7–4.0)
MCH: 30.4 pg (ref 26.0–34.0)
MCHC: 31.1 g/dL (ref 30.0–36.0)
MCV: 97.7 fL (ref 80.0–100.0)
Monocytes Absolute: 0.5 10*3/uL (ref 0.1–1.0)
Monocytes Relative: 9 %
Neutro Abs: 3.6 10*3/uL (ref 1.7–7.7)
Neutrophils Relative %: 61 %
Platelets: 276 10*3/uL (ref 150–400)
RBC: 3.95 MIL/uL — ABNORMAL LOW (ref 4.22–5.81)
RDW: 13.2 % (ref 11.5–15.5)
WBC: 5.9 10*3/uL (ref 4.0–10.5)
nRBC: 0 % (ref 0.0–0.2)

## 2024-04-12 LAB — COMPREHENSIVE METABOLIC PANEL WITH GFR
ALT: 15 U/L (ref 0–44)
AST: 17 U/L (ref 15–41)
Albumin: 3.2 g/dL — ABNORMAL LOW (ref 3.5–5.0)
Alkaline Phosphatase: 55 U/L (ref 38–126)
Anion gap: 8 (ref 5–15)
BUN: 15 mg/dL (ref 6–20)
CO2: 25 mmol/L (ref 22–32)
Calcium: 8.8 mg/dL — ABNORMAL LOW (ref 8.9–10.3)
Chloride: 105 mmol/L (ref 98–111)
Creatinine, Ser: 1.16 mg/dL (ref 0.61–1.24)
GFR, Estimated: >60 mL/min (ref >60)
Glucose, Bld: 95 mg/dL (ref 70–99)
Potassium: 4.7 mmol/L (ref 3.5–5.1)
Sodium: 138 mmol/L (ref 135–145)
Total Bilirubin: 1.1 mg/dL (ref 0.0–1.2)
Total Protein: 6.8 g/dL (ref 6.5–8.1)

## 2024-04-12 LAB — TROPONIN I (HIGH SENSITIVITY)
Troponin I (High Sensitivity): 77 ng/L — ABNORMAL HIGH (ref <18)
Troponin I (High Sensitivity): 75 ng/L — ABNORMAL HIGH (ref <18)

## 2024-04-12 MED ORDER — SACUBITRIL-VALSARTAN 49-51 MG PO TABS
1.0000 | ORAL_TABLET | Freq: Once | ORAL | Status: AC
  Administered 2024-04-12: 1 via ORAL
  Filled 2024-04-12: qty 1

## 2024-04-12 MED ORDER — ATORVASTATIN CALCIUM 40 MG PO TABS: 40.0000 mg | ORAL_TABLET | Freq: Every day | ORAL | 0 refills | Status: AC

## 2024-04-12 MED ORDER — ASPIRIN 81 MG PO CHEW: 81.0000 mg | CHEWABLE_TABLET | Freq: Every day | ORAL | 0 refills | Status: AC

## 2024-04-12 MED ORDER — SPIRONOLACTONE 25 MG PO TABS: 25.0000 mg | ORAL_TABLET | Freq: Every day | ORAL | 0 refills | Status: AC

## 2024-04-12 MED ORDER — ISOSORBIDE MONONITRATE ER 30 MG PO TB24: 30.0000 mg | ORAL_TABLET | Freq: Every day | ORAL | 0 refills | Status: AC

## 2024-04-12 MED ORDER — CLOPIDOGREL BISULFATE 75 MG PO TABS
75.0000 mg | ORAL_TABLET | Freq: Once | ORAL | Status: AC
Start: 2024-04-12 — End: 2024-04-12
  Administered 2024-04-12: 75 mg via ORAL
  Filled 2024-04-12: qty 1

## 2024-04-12 MED ORDER — EMPAGLIFLOZIN 10 MG PO TABS: 10.0000 mg | ORAL_TABLET | Freq: Every day | ORAL | 0 refills | Status: AC

## 2024-04-12 MED ORDER — ATORVASTATIN CALCIUM 40 MG PO TABS
40.0000 mg | ORAL_TABLET | Freq: Every day | ORAL | Status: DC
  Administered 2024-04-12: 40 mg via ORAL
  Filled 2024-04-12: qty 1

## 2024-04-12 MED ORDER — ISOSORBIDE MONONITRATE ER 30 MG PO TB24
30.0000 mg | ORAL_TABLET | Freq: Every day | ORAL | Status: DC
  Administered 2024-04-12: 30 mg via ORAL
  Filled 2024-04-12: qty 1

## 2024-04-12 MED ORDER — CLOPIDOGREL BISULFATE 75 MG PO TABS: 75.0000 mg | ORAL_TABLET | Freq: Every day | ORAL | 0 refills | Status: AC

## 2024-04-12 MED ORDER — ENTRESTO 49-51 MG PO TABS: 1.0000 | ORAL_TABLET | Freq: Two times a day (BID) | ORAL | 0 refills | Status: AC

## 2024-04-12 MED ORDER — FUROSEMIDE 40 MG PO TABS: 40.0000 mg | ORAL_TABLET | Freq: Every day | ORAL | 0 refills | Status: AC

## 2024-04-12 MED ORDER — ASPIRIN 81 MG PO CHEW
81.0000 mg | CHEWABLE_TABLET | Freq: Once | ORAL | Status: AC
  Administered 2024-04-12: 81 mg via ORAL
  Filled 2024-04-12: qty 1

## 2024-04-12 MED ORDER — CARVEDILOL 3.125 MG PO TABS
6.2500 mg | ORAL_TABLET | Freq: Once | ORAL | Status: AC
  Administered 2024-04-12: 6.25 mg via ORAL
  Filled 2024-04-12: qty 2

## 2024-04-12 MED ORDER — CARVEDILOL 6.25 MG PO TABS: 6.2500 mg | ORAL_TABLET | Freq: Two times a day (BID) | ORAL | 0 refills | Status: AC

## 2024-04-12 NOTE — ED Notes (Signed)
 At 1742 _ 04/12/2024, Pt called [facility] to report that [facility] can fill his prescriptions for him, but reports his insurance won't cover the prescriptions. Pt reports he plans on returning to [facility] for more medication until he can gather his prescribed medications from the pharmacy.

## 2024-04-12 NOTE — Discharge Instructions (Signed)
 Please take all medications as directed.  Return to the emergency department immediately for any new or worsening symptoms.

## 2024-04-12 NOTE — ED Notes (Signed)
 ED Provider at bedside.

## 2024-04-12 NOTE — ED Provider Notes (Signed)
 [place] EMERGENCY DEPARTMENT AT [facility] Provider Note   CSN: [id number] Arrival date & time: 04/12/24  7829     History  Chief Complaint  Patient presents with   Medication Refill    Isaac Wilkerson is a 59 y.o. male.  Patient is a 58 year old male who presents to the emergency department stating that he needs a refill of his medications.  He notes that he has been staying at a hotel and notes that housekeeping accidentally threw away all of his medications.  Patient does note that he takes these medications for his heart condition.  He notes that he has been out of his medications for approximately past 3 days.  He notes that he has had some associated exertional dyspnea.  He denies any active chest pain, abdominal pain, nausea, vomiting, diarrhea.  He has had no associated dizziness, lightheadedness or syncope.   Medication Refill      Home Medications Prior to Admission medications   Medication Sig Start Date End Date Taking? Authorizing Provider  acetaminophen  (TYLENOL ) 500 MG tablet Take 1,000 mg by mouth every 6 (six) hours as needed for mild pain (pain score 1-3) or moderate pain (pain score 4-6).    [provider]  aspirin  EC 81 MG tablet Take 81 mg by mouth daily.    [provider]  atorvastatin  (LIPITOR) 40 MG tablet Take 1 tablet (40 mg total) by mouth daily. 04/11/24   Ruddy Corral M, PA-C  carvedilol  (COREG ) 6.25 MG tablet Take 1 tablet (6.25 mg total) by mouth 2 (two) times daily with a meal. 04/11/24   Ruddy Corral M, PA-C  clopidogrel  (PLAVIX ) 75 MG tablet Take 1 tablet (75 mg total) by mouth daily. 04/11/24   Ruddy Corral M, PA-C  empagliflozin  (JARDIANCE ) 10 MG TABS tablet Take 1 tablet (10 mg total) by mouth daily. 04/11/24   Ruddy Corral M, PA-C  furosemide  (LASIX ) 40 MG tablet Take 1 tablet (40 mg total) by mouth daily. 04/11/24   Horace Lye, PA-C  isosorbide  mononitrate (IMDUR ) 30 MG 24 hr tablet  Take 1 tablet (30 mg total) by mouth daily. 04/11/24 11/06/27  Ruddy Corral M, PA-C  sacubitril -valsartan  (ENTRESTO ) 49-51 MG Take 1 tablet by mouth 2 (two) times daily. 03/01/24   Sheryl Donna, NP  spironolactone  (ALDACTONE ) 25 MG tablet Take 0.5 tablets (12.5 mg total) by mouth daily. 04/11/24   Horace Lye, PA-C      Allergies    Patient has no known allergies.    Review of Systems   Review of Systems  Respiratory:  Positive for shortness of breath.     Physical Exam Updated Vital Signs BP (!) 149/102   Pulse 69   Temp (!) 97.2 F (36.2 C)   Resp 17   Ht 5\' 6"  (1.676 m)   Wt 79.3 kg   SpO2 99%   BMI 28.22 kg/m  Physical Exam Vitals and nursing note reviewed.  Constitutional:      Appearance: Normal appearance.  HENT:     Head: Normocephalic and atraumatic.     Nose: Nose normal.     Mouth/Throat:     Mouth: Mucous membranes are moist.  Eyes:     Extraocular Movements: Extraocular movements intact.     Conjunctiva/sclera: Conjunctivae normal.     Pupils: Pupils are equal, round, and reactive to light.  Cardiovascular:     Rate and Rhythm: Normal rate and regular rhythm.     Pulses: Normal pulses.  Heart sounds: Normal heart sounds. No murmur heard.    No gallop.  Pulmonary:     Effort: Pulmonary effort is normal. No respiratory distress.     Breath sounds: Normal breath sounds. No stridor. No wheezing, rhonchi or rales.  Abdominal:     General: Abdomen is flat. Bowel sounds are normal. There is no distension.     Palpations: Abdomen is soft.     Tenderness: There is no abdominal tenderness. There is no guarding.  Musculoskeletal:        General: Normal range of motion.     Cervical back: Normal range of motion and neck supple.  Skin:    General: Skin is warm and dry.  Neurological:     General: No focal deficit present.     Mental Status: He is alert and oriented to person, place, and time. Mental status is at baseline.  Psychiatric:         Mood and Affect: Mood normal.        Behavior: Behavior normal.        Thought Content: Thought content normal.        Judgment: Judgment normal.     ED Results / Procedures / Treatments   Labs (all labs ordered are listed, but only abnormal results are displayed) Labs Reviewed  COMPREHENSIVE METABOLIC PANEL WITH GFR  CBC WITH DIFFERENTIAL/PLATELET  TROPONIN I (HIGH SENSITIVITY)    EKG None  Radiology No results found.  Procedures Procedures    Medications Ordered in ED Medications  isosorbide  mononitrate (IMDUR ) 24 hr tablet 30 mg (30 mg Oral Given 04/12/24 1152)  atorvastatin  (LIPITOR) tablet 40 mg (40 mg Oral Given 04/12/24 1152)  sacubitril -valsartan  (ENTRESTO ) 49-51 mg per tablet (1 tablet Oral Given 04/12/24 1152)  carvedilol  (COREG ) tablet 6.25 mg (6.25 mg Oral Given 04/12/24 1152)  clopidogrel  (PLAVIX ) tablet 75 mg (75 mg Oral Given 04/12/24 1152)  aspirin  chewable tablet 81 mg (81 mg Oral Given 04/12/24 1151)    ED Course/ Medical Decision Making/ A&P Clinical Course as of 04/12/24 1541  Tue Apr 12, 2024  1213 EKG interpretation: Normal sinus rhythm, rate of 58, normal PR/QRS interval, normal QTc, T wave inversions in lateral and anterior leads, no ST changes, no STEMI, EKG consistent with previous, normal axis [CR]    Clinical Course User Index [CR] Roselynn Connors, PA-C                                 Medical Decision Making Amount and/or Complexity of Data Reviewed Labs: ordered. Radiology: ordered.  Risk OTC drugs. Prescription drug management.   This patient presents to the ED for concern of medication refill, dyspnea differential diagnosis includes ACS, pulmonary embolus, acute CHF, pericarditis, myocarditis, endocarditis, sepsis, pneumonia    Additional history obtained:  Additional history obtained from medical records External records from outside source obtained and reviewed including medical records   Lab Tests:  I Ordered, and  personally interpreted labs.  The pertinent results include: Elevated troponin but stable with serial troponins, mild anemia, no leukocytosis, normal kidney function liver function, normal electrolytes   Imaging Studies ordered:  I ordered imaging studies including chest x-ray I independently visualized and interpreted imaging which showed mild pulmonary edema I agree with the radiologist interpretation    Problem List / ED Course:  Patient is doing well at this time and is stable for discharge home.  He remains asymptomatic at  this point.  Patient's blood work has been overall unremarkable.  Serial troponins have been flat.  Did discuss patient case with Dr. Amanda Jungling with cardiology who does note that as long as the patient serial troponins do not change he can be discharged home continue back on his home medications.  I did refill his home medications at this point and he will start taking them immediately.  He has no clinical indication for fluid overload at this point.  Do not suspect underlying infectious process or pneumonia.  The need for continued close follow-up with his cardiologist on outpatient basis was discussed.  Strict return precautions were discussed for any new or worsening symptoms.  Patient voiced understanding and had no additional questions.   Social Determinants of Health:  None           Final Clinical Impression(s) / ED Diagnoses Final diagnoses:  None    Rx / DC Orders ED Discharge Orders     None         Emmalene Hare 04/12/24 1543    Iva Mariner, MD 04/12/24 1701

## 2024-04-12 NOTE — ED Triage Notes (Signed)
 Pt arrived via POV c/o needing a medication refill. Pt reports he has been staying in a motel and housekeeping threw away his medications that were in the room. Pt reports it has been 3 days since he has had his Blood pressure and heart medicine.

## 2024-04-27 ENCOUNTER — Other Ambulatory Visit (HOSPITAL_COMMUNITY): Payer: Self-pay

## 2024-07-13 ENCOUNTER — Emergency Department (HOSPITAL_COMMUNITY)
Admission: EM | Admit: 2024-07-13 | Discharge: 2024-07-13 | Disposition: A | Discharge status: Home / Self Care | Attending: Emergency Medicine | Admitting: Emergency Medicine

## 2024-09-01 ENCOUNTER — Emergency Department (HOSPITAL_COMMUNITY)

## 2024-09-01 ENCOUNTER — Emergency Department (HOSPITAL_COMMUNITY)
Admission: EM | Admit: 2024-09-01 | Discharge: 2024-09-01 | Disposition: A | Discharge status: Home / Self Care | Attending: Emergency Medicine | Admitting: Emergency Medicine

## 2024-09-01 ENCOUNTER — Other Ambulatory Visit: Payer: Self-pay

## 2024-09-01 DIAGNOSIS — I110 Hypertensive heart disease with heart failure: Secondary | ICD-10-CM | POA: Diagnosis not present

## 2024-09-01 DIAGNOSIS — R9389 Abnormal findings on diagnostic imaging of other specified body structures: Secondary | ICD-10-CM | POA: Diagnosis not present

## 2024-09-01 DIAGNOSIS — I10 Essential (primary) hypertension: Secondary | ICD-10-CM | POA: Diagnosis not present

## 2024-09-01 DIAGNOSIS — Z7982 Long term (current) use of aspirin: Secondary | ICD-10-CM | POA: Diagnosis not present

## 2024-09-01 DIAGNOSIS — R072 Precordial pain: Secondary | ICD-10-CM | POA: Diagnosis present

## 2024-09-01 DIAGNOSIS — I509 Heart failure, unspecified: Secondary | ICD-10-CM | POA: Diagnosis not present

## 2024-09-01 DIAGNOSIS — R079 Chest pain, unspecified: Secondary | ICD-10-CM

## 2024-09-01 DIAGNOSIS — R0602 Shortness of breath: Secondary | ICD-10-CM | POA: Diagnosis not present

## 2024-09-01 DIAGNOSIS — Z7902 Long term (current) use of antithrombotics/antiplatelets: Secondary | ICD-10-CM | POA: Diagnosis not present

## 2024-09-01 DIAGNOSIS — R0789 Other chest pain: Secondary | ICD-10-CM | POA: Diagnosis not present

## 2024-09-01 DIAGNOSIS — I1A0 Resistant hypertension: Secondary | ICD-10-CM

## 2024-09-01 LAB — BASIC METABOLIC PANEL WITH GFR
Anion gap: 11 (ref 5–15)
BUN: 18 mg/dL (ref 6–20)
CO2: 25 mmol/L (ref 22–32)
Calcium: 8.8 mg/dL — ABNORMAL LOW (ref 8.9–10.3)
Chloride: 105 mmol/L (ref 98–111)
Creatinine, Ser: 1.31 mg/dL — ABNORMAL HIGH (ref 0.61–1.24)
GFR, Estimated: >60 mL/min (ref >60)
Glucose, Bld: 86 mg/dL (ref 70–99)
Potassium: 3.6 mmol/L (ref 3.5–5.1)
Sodium: 141 mmol/L (ref 135–145)

## 2024-09-01 LAB — TROPONIN I (HIGH SENSITIVITY)
Troponin I (High Sensitivity): 41 ng/L — ABNORMAL HIGH (ref <18)
Troponin I (High Sensitivity): 41 ng/L — ABNORMAL HIGH (ref <18)

## 2024-09-01 LAB — CBC
HCT: 39.9 % (ref 39.0–52.0)
Hemoglobin: 12.7 g/dL — ABNORMAL LOW (ref 13.0–17.0)
MCH: 31.4 pg (ref 26.0–34.0)
MCHC: 31.8 g/dL (ref 30.0–36.0)
MCV: 98.8 fL (ref 80.0–100.0)
Platelets: 240 K/uL (ref 150–400)
RBC: 4.04 MIL/uL — ABNORMAL LOW (ref 4.22–5.81)
RDW: 12.1 % (ref 11.5–15.5)
WBC: 5.7 K/uL (ref 4.0–10.5)
nRBC: 0 % (ref 0.0–0.2)

## 2024-09-01 LAB — BRAIN NATRIURETIC PEPTIDE: B Natriuretic Peptide: 774 pg/mL — ABNORMAL HIGH (ref 0.0–100.0)

## 2024-09-01 MED ORDER — SACUBITRIL-VALSARTAN 49-51 MG PO TABS
1.0000 | ORAL_TABLET | Freq: Two times a day (BID) | ORAL | Status: DC
  Administered 2024-09-01: 1 via ORAL
  Filled 2024-09-01: qty 1

## 2024-09-01 MED ORDER — CARVEDILOL 3.125 MG PO TABS: 6.2500 mg | ORAL_TABLET | Freq: Two times a day (BID) | ORAL | Status: DC

## 2024-09-01 MED ORDER — ASPIRIN 81 MG PO CHEW
324.0000 mg | CHEWABLE_TABLET | Freq: Once | ORAL | Status: AC
  Administered 2024-09-01: 324 mg via ORAL
  Filled 2024-09-01: qty 4

## 2024-09-01 MED ORDER — ISOSORBIDE MONONITRATE ER 30 MG PO TB24
30.0000 mg | ORAL_TABLET | Freq: Every day | ORAL | Status: DC
  Administered 2024-09-01: 30 mg via ORAL
  Filled 2024-09-01: qty 1

## 2024-09-01 MED ORDER — SPIRONOLACTONE 25 MG PO TABS
25.0000 mg | ORAL_TABLET | Freq: Every day | ORAL | Status: DC
  Administered 2024-09-01: 25 mg via ORAL
  Filled 2024-09-01: qty 1

## 2024-09-01 NOTE — ED Provider Notes (Signed)
 [place] EMERGENCY DEPARTMENT AT [facility] Provider Note   CSN: [id number] Arrival date & time: 09/01/24  9277     Patient presents with: Chest Pain   Isaac Wilkerson is a 58 y.o. male.  He has a history of hypertension heart failure MI.  Yesterday while eating he had a sharp chest pain that lasted about a second.  Since then he has had some soreness in his chest.  Associated with shortness of breath.  Symptoms continued this morning so he came here for evaluation.  Rates his pain as 7 out of 10 but calls that discomfort and soreness.  No abdominal pain vomiting diarrhea fevers chills.  He does smoke cigarettes and used cocaine 4- 5 days ago.  Last saw cardiology about 6 months ago.  No history of stents   The history is provided by the patient.  Chest Pain Pain location:  Substernal area Pain quality: aching, sharp and stabbing   Pain severity:  Severe Onset quality:  Sudden Timing:  Intermittent Progression:  Partially resolved Chronicity:  New Context: eating   Relieved by:  None tried Worsened by:  Nothing Ineffective treatments:  None tried Associated symptoms: shortness of breath   Associated symptoms: no abdominal pain, no back pain, no cough, no diaphoresis, no dizziness, no fever, no nausea and no vomiting   Risk factors: coronary artery disease, hypertension, male sex and smoking        Prior to Admission medications   Medication Sig Start Date End Date Taking? Authorizing Provider  acetaminophen  (TYLENOL ) 500 MG tablet Take 1,000 mg by mouth every 6 (six) hours as needed for mild pain (pain score 1-3) or moderate pain (pain score 4-6).    [provider]  aspirin  81 MG chewable tablet Chew 1 tablet (81 mg total) by mouth daily. 04/12/24   Daralene Lonni BIRCH, PA-C  atorvastatin  (LIPITOR) 40 MG tablet Take 1 tablet (40 mg total) by mouth daily. 04/12/24   Daralene Lonni BIRCH, PA-C  carvedilol  (COREG ) 6.25 MG tablet Take 1 tablet (6.25 mg total)  by mouth 2 (two) times daily with a meal. 04/12/24   Rigney, Lonni BIRCH, PA-C  clopidogrel  (PLAVIX ) 75 MG tablet Take 1 tablet (75 mg total) by mouth daily. 04/12/24   Daralene Lonni BIRCH, PA-C  empagliflozin  (JARDIANCE ) 10 MG TABS tablet Take 1 tablet (10 mg total) by mouth daily before breakfast. 04/12/24   Daralene Lonni BIRCH, PA-C  furosemide  (LASIX ) 40 MG tablet Take 1 tablet (40 mg total) by mouth daily. 04/12/24   Daralene Lonni BIRCH, PA-C  isosorbide  mononitrate (IMDUR ) 30 MG 24 hr tablet Take 1 tablet (30 mg total) by mouth daily. 04/12/24   Daralene Lonni D, PA-C  sacubitril -valsartan  (ENTRESTO ) 49-51 MG Take 1 tablet by mouth 2 (two) times daily. 04/12/24   Daralene Lonni BIRCH, PA-C  spironolactone  (ALDACTONE ) 25 MG tablet Take 1 tablet (25 mg total) by mouth daily. 04/12/24   Daralene Lonni BIRCH, PA-C    Allergies: Patient has no known allergies.    Review of Systems  Constitutional:  Negative for diaphoresis and fever.  Eyes:  Negative for visual disturbance.  Respiratory:  Positive for shortness of breath. Negative for cough.   Cardiovascular:  Positive for chest pain.  Gastrointestinal:  Negative for abdominal pain, nausea and vomiting.  Musculoskeletal:  Negative for back pain.  Neurological:  Negative for dizziness.    Updated Vital Signs BP (!) 169/123   Pulse (!) 56   Temp 97.8 F (36.6  C) (Oral)   Resp 19   Ht 5' 6 (1.676 m)   Wt 75.3 kg   SpO2 95%   BMI 26.79 kg/m   Physical Exam Vitals and nursing note reviewed.  Constitutional:      General: He is not in acute distress.    Appearance: Normal appearance. He is well-developed.  HENT:     Head: Normocephalic and atraumatic.  Eyes:     Conjunctiva/sclera: Conjunctivae normal.  Cardiovascular:     Rate and Rhythm: Normal rate and regular rhythm.     Heart sounds: Normal heart sounds. No murmur heard. Pulmonary:     Effort: Pulmonary effort is normal. No respiratory distress.     Breath  sounds: Normal breath sounds.  Abdominal:     Palpations: Abdomen is soft.     Tenderness: There is no abdominal tenderness. There is no guarding or rebound.  Musculoskeletal:        General: No swelling.     Cervical back: Neck supple.     Right lower leg: No tenderness. No edema.     Left lower leg: No tenderness. No edema.  Skin:    General: Skin is warm and dry.     Capillary Refill: Capillary refill takes less than 2 seconds.  Neurological:     General: No focal deficit present.     Mental Status: He is alert.     (all labs ordered are listed, but only abnormal results are displayed) Labs Reviewed  BASIC METABOLIC PANEL WITH GFR - Abnormal; Notable for the following components:      Result Value   Creatinine, Ser 1.31 (*)    Calcium  8.8 (*)    All other components within normal limits  CBC - Abnormal; Notable for the following components:   RBC 4.04 (*)    Hemoglobin 12.7 (*)    All other components within normal limits  BRAIN NATRIURETIC PEPTIDE - Abnormal; Notable for the following components:   B Natriuretic Peptide 774.0 (*)    All other components within normal limits  TROPONIN I (HIGH SENSITIVITY) - Abnormal; Notable for the following components:   Troponin I (High Sensitivity) 41 (*)    All other components within normal limits  TROPONIN I (HIGH SENSITIVITY) - Abnormal; Notable for the following components:   Troponin I (High Sensitivity) 41 (*)    All other components within normal limits    EKG: EKG Interpretation Date/Time:  Thursday September 01 2024 07:32:12 EDT Ventricular Rate:  60 PR Interval:  147 QRS Duration:  106 QT Interval:  458 QTC Calculation: 458 R Axis:   61  Text Interpretation: Sinus rhythm Probable LVH with secondary repol abnrm No significant change since prior 4/25 Confirmed by Towana Sharper [phone]) on 09/01/2024 7:38:39 AM  Radiology: [facility] Chest Port 1 View Result Date: 09/01/2024 CLINICAL DATA:  Chest pain.  Shortness of  breath. EXAM: PORTABLE CHEST 1 VIEW COMPARISON:  04/12/2024. FINDINGS: Mildly elevated right hemidiaphragm. Bilateral lung fields are clear. Bilateral costophrenic angles are clear. Stable cardio-mediastinal silhouette. No acute osseous abnormalities. The soft tissues are within normal limits. IMPRESSION: No active disease. Electronically Signed   By: Ree Molt M.D.   On: 09/01/2024 07:53     Procedures   Medications Ordered in the ED  aspirin  chewable tablet 324 mg (324 mg Oral Given 09/01/24 0747)    Clinical Course as of 09/01/24 1712  Thu Sep 01, 2024  0750 Chest x-ray interpreted by me as cardiomegaly no gross infiltrate.  Awaiting radiology reading. [MB]  1008 Reviewed results of workup with patient.  Blood pressure was elevated here although did not take his medications.  Will try to get his medications at home.  Recommended close follow-up with PCP and his cardiologist. [MB]    Clinical Course User Index [MB] Towana Ozell BROCKS, MD                                 Medical Decision Making Amount and/or Complexity of Data Reviewed Labs: ordered. Radiology: ordered.  Risk OTC drugs.   This patient complains of pain and shortness of breath; this involves an extensive number of treatment Options and is a complaint that carries with it a high risk of complications and morbidity. The differential includes ACS, pneumonia, pneumothorax, PE, vascular, musculoskeletal, reflux  I ordered, reviewed and interpreted labs, which included CBC with chronically low hemoglobin, chemistries mildly elevated creatinine, troponins mildly elevated but flat, BNP elevated, similar to prior I ordered medication aspirin  and reviewed PMP when indicated. I ordered imaging studies which included chest x-ray and I independently    visualized and interpreted imaging which showed no acute findings Previous records obtained and reviewed in epic including prior cardiology notes Cardiac monitoring  reviewed, sinus bradycardia Social determinants considered, tobacco use Critical Interventions: None  After the interventions stated above, I reevaluated the patient and found patient's pain to be improved and vitals stable although with elevated blood pressure Admission and further testing considered, no indications for admission.  Counseled patient to avoid cocaine and follow-up with cardiology.  Return instructions discussed.      Final diagnoses:  Nonspecific chest pain  Resistant hypertension    ED Discharge Orders     None          Towana Ozell BROCKS, MD 09/01/24 1715

## 2024-09-01 NOTE — ED Triage Notes (Signed)
 Pt. c/o chest pain and ShOB. Onset yesterday evening after a meal. Pt states that pain is constant, rates pain 7/10. Pt states he has been around a grandchild that is sick. Denies N/V/D, headache. Hx of cardiovascular disease; pt state he's had three heart attacks before. A&Ox4.

## 2024-09-01 NOTE — Discharge Instructions (Signed)
 You were seen in the emergency department for an episode of chest pain.  You had blood work EKG and chest x-ray that did not show any evidence of a heart attack.  Your blood pressure was elevated here.  Please take your medication as scheduled and follow-up with your primary care doctor and cardiologist.  Return if any worsening or concerning symptoms

## 2024-10-05 DIAGNOSIS — R079 Chest pain, unspecified: Secondary | ICD-10-CM | POA: Diagnosis not present

## 2024-10-05 DIAGNOSIS — F1499 Cocaine use, unspecified with unspecified cocaine-induced disorder: Secondary | ICD-10-CM | POA: Diagnosis not present

## 2024-10-05 DIAGNOSIS — F17210 Nicotine dependence, cigarettes, uncomplicated: Secondary | ICD-10-CM | POA: Diagnosis not present

## 2024-10-05 DIAGNOSIS — R9431 Abnormal electrocardiogram [ECG] [EKG]: Secondary | ICD-10-CM | POA: Diagnosis not present

## 2024-10-05 DIAGNOSIS — F1910 Other psychoactive substance abuse, uncomplicated: Secondary | ICD-10-CM | POA: Diagnosis not present

## 2024-10-05 DIAGNOSIS — M5430 Sciatica, unspecified side: Secondary | ICD-10-CM | POA: Diagnosis not present

## 2024-10-05 DIAGNOSIS — R0789 Other chest pain: Secondary | ICD-10-CM | POA: Diagnosis not present

## 2024-10-05 DIAGNOSIS — J811 Chronic pulmonary edema: Secondary | ICD-10-CM | POA: Diagnosis not present

## 2024-10-05 DIAGNOSIS — M5432 Sciatica, left side: Secondary | ICD-10-CM | POA: Diagnosis not present

## 2024-10-05 DIAGNOSIS — M25552 Pain in left hip: Secondary | ICD-10-CM | POA: Diagnosis not present

## 2024-10-24 DIAGNOSIS — M1990 Unspecified osteoarthritis, unspecified site: Secondary | ICD-10-CM | POA: Diagnosis not present

## 2024-10-24 DIAGNOSIS — I10 Essential (primary) hypertension: Secondary | ICD-10-CM | POA: Diagnosis not present

## 2024-10-24 DIAGNOSIS — F1024 Alcohol dependence with alcohol-induced mood disorder: Secondary | ICD-10-CM | POA: Diagnosis not present

## 2024-10-24 DIAGNOSIS — Z5982 Transportation insecurity: Secondary | ICD-10-CM | POA: Diagnosis not present

## 2024-10-24 DIAGNOSIS — F332 Major depressive disorder, recurrent severe without psychotic features: Secondary | ICD-10-CM | POA: Diagnosis not present

## 2024-10-24 DIAGNOSIS — I2510 Atherosclerotic heart disease of native coronary artery without angina pectoris: Secondary | ICD-10-CM | POA: Diagnosis not present

## 2024-10-24 DIAGNOSIS — F1424 Cocaine dependence with cocaine-induced mood disorder: Secondary | ICD-10-CM | POA: Diagnosis not present

## 2024-10-24 DIAGNOSIS — I509 Heart failure, unspecified: Secondary | ICD-10-CM | POA: Diagnosis not present

## 2024-10-24 DIAGNOSIS — F17210 Nicotine dependence, cigarettes, uncomplicated: Secondary | ICD-10-CM | POA: Diagnosis not present

## 2024-10-24 DIAGNOSIS — F10239 Alcohol dependence with withdrawal, unspecified: Secondary | ICD-10-CM | POA: Diagnosis not present

## 2024-11-03 DIAGNOSIS — F1020 Alcohol dependence, uncomplicated: Secondary | ICD-10-CM | POA: Diagnosis not present

## 2024-11-03 DIAGNOSIS — G4733 Obstructive sleep apnea (adult) (pediatric): Secondary | ICD-10-CM | POA: Diagnosis not present

## 2024-11-03 DIAGNOSIS — I110 Hypertensive heart disease with heart failure: Secondary | ICD-10-CM | POA: Diagnosis not present

## 2024-11-03 DIAGNOSIS — I509 Heart failure, unspecified: Secondary | ICD-10-CM | POA: Diagnosis not present

## 2024-11-03 DIAGNOSIS — F1910 Other psychoactive substance abuse, uncomplicated: Secondary | ICD-10-CM | POA: Diagnosis not present

## 2024-11-03 DIAGNOSIS — G4730 Sleep apnea, unspecified: Secondary | ICD-10-CM | POA: Diagnosis not present

## 2024-11-03 DIAGNOSIS — F1490 Cocaine use, unspecified, uncomplicated: Secondary | ICD-10-CM | POA: Diagnosis not present

## 2024-11-03 DIAGNOSIS — F17210 Nicotine dependence, cigarettes, uncomplicated: Secondary | ICD-10-CM | POA: Diagnosis not present

## 2024-11-03 DIAGNOSIS — I10 Essential (primary) hypertension: Secondary | ICD-10-CM | POA: Diagnosis not present

## 2024-11-03 DIAGNOSIS — I2510 Atherosclerotic heart disease of native coronary artery without angina pectoris: Secondary | ICD-10-CM | POA: Diagnosis not present

## 2024-11-04 DIAGNOSIS — F419 Anxiety disorder, unspecified: Secondary | ICD-10-CM | POA: Diagnosis not present

## 2024-11-04 DIAGNOSIS — F1020 Alcohol dependence, uncomplicated: Secondary | ICD-10-CM | POA: Diagnosis not present

## 2024-11-04 DIAGNOSIS — Z5900 Homelessness unspecified: Secondary | ICD-10-CM | POA: Diagnosis not present

## 2024-11-04 DIAGNOSIS — Z720 Tobacco use: Secondary | ICD-10-CM | POA: Diagnosis not present

## 2024-11-04 DIAGNOSIS — Z5941 Food insecurity: Secondary | ICD-10-CM | POA: Diagnosis not present

## 2024-11-04 DIAGNOSIS — F1420 Cocaine dependence, uncomplicated: Secondary | ICD-10-CM | POA: Diagnosis not present

## 2024-11-05 DIAGNOSIS — Z5941 Food insecurity: Secondary | ICD-10-CM | POA: Diagnosis not present

## 2024-11-05 DIAGNOSIS — Z720 Tobacco use: Secondary | ICD-10-CM | POA: Diagnosis not present

## 2024-11-05 DIAGNOSIS — Z5900 Homelessness unspecified: Secondary | ICD-10-CM | POA: Diagnosis not present

## 2024-11-05 DIAGNOSIS — F1420 Cocaine dependence, uncomplicated: Secondary | ICD-10-CM | POA: Diagnosis not present

## 2024-11-05 DIAGNOSIS — F419 Anxiety disorder, unspecified: Secondary | ICD-10-CM | POA: Diagnosis not present

## 2024-11-05 DIAGNOSIS — F1020 Alcohol dependence, uncomplicated: Secondary | ICD-10-CM | POA: Diagnosis not present

## 2024-11-06 DIAGNOSIS — F1420 Cocaine dependence, uncomplicated: Secondary | ICD-10-CM | POA: Diagnosis not present

## 2024-11-06 DIAGNOSIS — F1020 Alcohol dependence, uncomplicated: Secondary | ICD-10-CM | POA: Diagnosis not present

## 2024-11-06 DIAGNOSIS — F419 Anxiety disorder, unspecified: Secondary | ICD-10-CM | POA: Diagnosis not present

## 2024-11-06 DIAGNOSIS — Z5900 Homelessness unspecified: Secondary | ICD-10-CM | POA: Diagnosis not present

## 2024-11-06 DIAGNOSIS — Z5941 Food insecurity: Secondary | ICD-10-CM | POA: Diagnosis not present

## 2024-11-06 DIAGNOSIS — Z720 Tobacco use: Secondary | ICD-10-CM | POA: Diagnosis not present

## 2024-11-07 DIAGNOSIS — Z5900 Homelessness unspecified: Secondary | ICD-10-CM | POA: Diagnosis not present

## 2024-11-07 DIAGNOSIS — F1420 Cocaine dependence, uncomplicated: Secondary | ICD-10-CM | POA: Diagnosis not present

## 2024-11-07 DIAGNOSIS — Z720 Tobacco use: Secondary | ICD-10-CM | POA: Diagnosis not present

## 2024-11-07 DIAGNOSIS — F1020 Alcohol dependence, uncomplicated: Secondary | ICD-10-CM | POA: Diagnosis not present

## 2024-11-07 DIAGNOSIS — Z5941 Food insecurity: Secondary | ICD-10-CM | POA: Diagnosis not present

## 2024-11-07 DIAGNOSIS — F419 Anxiety disorder, unspecified: Secondary | ICD-10-CM | POA: Diagnosis not present

## 2024-11-07 DIAGNOSIS — I10 Essential (primary) hypertension: Secondary | ICD-10-CM | POA: Diagnosis not present

## 2024-11-08 DIAGNOSIS — F1020 Alcohol dependence, uncomplicated: Secondary | ICD-10-CM | POA: Diagnosis not present

## 2024-11-08 DIAGNOSIS — Z720 Tobacco use: Secondary | ICD-10-CM | POA: Diagnosis not present

## 2024-11-08 DIAGNOSIS — Z5941 Food insecurity: Secondary | ICD-10-CM | POA: Diagnosis not present

## 2024-11-08 DIAGNOSIS — I10 Essential (primary) hypertension: Secondary | ICD-10-CM | POA: Diagnosis not present

## 2024-11-08 DIAGNOSIS — Z5900 Homelessness unspecified: Secondary | ICD-10-CM | POA: Diagnosis not present

## 2024-11-08 DIAGNOSIS — F1420 Cocaine dependence, uncomplicated: Secondary | ICD-10-CM | POA: Diagnosis not present

## 2024-11-08 DIAGNOSIS — F419 Anxiety disorder, unspecified: Secondary | ICD-10-CM | POA: Diagnosis not present

## 2024-11-09 DIAGNOSIS — F1420 Cocaine dependence, uncomplicated: Secondary | ICD-10-CM | POA: Diagnosis not present

## 2024-11-09 DIAGNOSIS — Z720 Tobacco use: Secondary | ICD-10-CM | POA: Diagnosis not present

## 2024-11-09 DIAGNOSIS — F1020 Alcohol dependence, uncomplicated: Secondary | ICD-10-CM | POA: Diagnosis not present

## 2024-11-09 DIAGNOSIS — Z5941 Food insecurity: Secondary | ICD-10-CM | POA: Diagnosis not present

## 2024-11-09 DIAGNOSIS — F419 Anxiety disorder, unspecified: Secondary | ICD-10-CM | POA: Diagnosis not present

## 2024-11-09 DIAGNOSIS — Z5900 Homelessness unspecified: Secondary | ICD-10-CM | POA: Diagnosis not present

## 2024-11-10 DIAGNOSIS — F1420 Cocaine dependence, uncomplicated: Secondary | ICD-10-CM | POA: Diagnosis not present

## 2024-11-10 DIAGNOSIS — Z5941 Food insecurity: Secondary | ICD-10-CM | POA: Diagnosis not present

## 2024-11-10 DIAGNOSIS — Z720 Tobacco use: Secondary | ICD-10-CM | POA: Diagnosis not present

## 2024-11-10 DIAGNOSIS — F419 Anxiety disorder, unspecified: Secondary | ICD-10-CM | POA: Diagnosis not present

## 2024-11-10 DIAGNOSIS — F1020 Alcohol dependence, uncomplicated: Secondary | ICD-10-CM | POA: Diagnosis not present

## 2024-11-10 DIAGNOSIS — I10 Essential (primary) hypertension: Secondary | ICD-10-CM | POA: Diagnosis not present

## 2024-11-10 DIAGNOSIS — Z5900 Homelessness unspecified: Secondary | ICD-10-CM | POA: Diagnosis not present

## 2024-11-11 DIAGNOSIS — Z720 Tobacco use: Secondary | ICD-10-CM | POA: Diagnosis not present

## 2024-11-11 DIAGNOSIS — F1420 Cocaine dependence, uncomplicated: Secondary | ICD-10-CM | POA: Diagnosis not present

## 2024-11-11 DIAGNOSIS — Z5941 Food insecurity: Secondary | ICD-10-CM | POA: Diagnosis not present

## 2024-11-11 DIAGNOSIS — Z5900 Homelessness unspecified: Secondary | ICD-10-CM | POA: Diagnosis not present

## 2024-11-11 DIAGNOSIS — F419 Anxiety disorder, unspecified: Secondary | ICD-10-CM | POA: Diagnosis not present

## 2024-11-11 DIAGNOSIS — F1020 Alcohol dependence, uncomplicated: Secondary | ICD-10-CM | POA: Diagnosis not present

## 2024-11-12 DIAGNOSIS — F1020 Alcohol dependence, uncomplicated: Secondary | ICD-10-CM | POA: Diagnosis not present

## 2024-11-12 DIAGNOSIS — Z720 Tobacco use: Secondary | ICD-10-CM | POA: Diagnosis not present

## 2024-11-12 DIAGNOSIS — F1420 Cocaine dependence, uncomplicated: Secondary | ICD-10-CM | POA: Diagnosis not present

## 2024-11-12 DIAGNOSIS — Z5941 Food insecurity: Secondary | ICD-10-CM | POA: Diagnosis not present

## 2024-11-12 DIAGNOSIS — Z5900 Homelessness unspecified: Secondary | ICD-10-CM | POA: Diagnosis not present

## 2024-11-12 DIAGNOSIS — F419 Anxiety disorder, unspecified: Secondary | ICD-10-CM | POA: Diagnosis not present

## 2024-11-13 DIAGNOSIS — F1420 Cocaine dependence, uncomplicated: Secondary | ICD-10-CM | POA: Diagnosis not present

## 2024-11-13 DIAGNOSIS — Z5941 Food insecurity: Secondary | ICD-10-CM | POA: Diagnosis not present

## 2024-11-13 DIAGNOSIS — F1020 Alcohol dependence, uncomplicated: Secondary | ICD-10-CM | POA: Diagnosis not present

## 2024-11-13 DIAGNOSIS — Z720 Tobacco use: Secondary | ICD-10-CM | POA: Diagnosis not present

## 2024-11-13 DIAGNOSIS — F419 Anxiety disorder, unspecified: Secondary | ICD-10-CM | POA: Diagnosis not present

## 2024-11-13 DIAGNOSIS — Z5900 Homelessness unspecified: Secondary | ICD-10-CM | POA: Diagnosis not present

## 2024-11-14 DIAGNOSIS — F1020 Alcohol dependence, uncomplicated: Secondary | ICD-10-CM | POA: Diagnosis not present

## 2024-11-14 DIAGNOSIS — Z720 Tobacco use: Secondary | ICD-10-CM | POA: Diagnosis not present

## 2024-11-14 DIAGNOSIS — F1420 Cocaine dependence, uncomplicated: Secondary | ICD-10-CM | POA: Diagnosis not present

## 2024-11-14 DIAGNOSIS — Z5941 Food insecurity: Secondary | ICD-10-CM | POA: Diagnosis not present

## 2024-11-14 DIAGNOSIS — F419 Anxiety disorder, unspecified: Secondary | ICD-10-CM | POA: Diagnosis not present

## 2024-11-14 DIAGNOSIS — Z5900 Homelessness unspecified: Secondary | ICD-10-CM | POA: Diagnosis not present

## 2024-11-15 DIAGNOSIS — F1420 Cocaine dependence, uncomplicated: Secondary | ICD-10-CM | POA: Diagnosis not present

## 2024-11-15 DIAGNOSIS — F1020 Alcohol dependence, uncomplicated: Secondary | ICD-10-CM | POA: Diagnosis not present

## 2024-11-15 DIAGNOSIS — Z5900 Homelessness unspecified: Secondary | ICD-10-CM | POA: Diagnosis not present

## 2024-11-15 DIAGNOSIS — Z5941 Food insecurity: Secondary | ICD-10-CM | POA: Diagnosis not present

## 2024-11-15 DIAGNOSIS — Z720 Tobacco use: Secondary | ICD-10-CM | POA: Diagnosis not present

## 2024-11-15 DIAGNOSIS — F419 Anxiety disorder, unspecified: Secondary | ICD-10-CM | POA: Diagnosis not present

## 2024-11-16 DIAGNOSIS — F1420 Cocaine dependence, uncomplicated: Secondary | ICD-10-CM | POA: Diagnosis not present

## 2024-11-16 DIAGNOSIS — F1020 Alcohol dependence, uncomplicated: Secondary | ICD-10-CM | POA: Diagnosis not present

## 2024-11-16 DIAGNOSIS — Z720 Tobacco use: Secondary | ICD-10-CM | POA: Diagnosis not present

## 2024-11-16 DIAGNOSIS — Z5941 Food insecurity: Secondary | ICD-10-CM | POA: Diagnosis not present

## 2024-11-16 DIAGNOSIS — Z5900 Homelessness unspecified: Secondary | ICD-10-CM | POA: Diagnosis not present

## 2024-11-16 DIAGNOSIS — I10 Essential (primary) hypertension: Secondary | ICD-10-CM | POA: Diagnosis not present

## 2024-11-16 DIAGNOSIS — F419 Anxiety disorder, unspecified: Secondary | ICD-10-CM | POA: Diagnosis not present

## 2024-11-17 DIAGNOSIS — Z5941 Food insecurity: Secondary | ICD-10-CM | POA: Diagnosis not present

## 2024-11-17 DIAGNOSIS — F419 Anxiety disorder, unspecified: Secondary | ICD-10-CM | POA: Diagnosis not present

## 2024-11-17 DIAGNOSIS — F1020 Alcohol dependence, uncomplicated: Secondary | ICD-10-CM | POA: Diagnosis not present

## 2024-11-17 DIAGNOSIS — Z5900 Homelessness unspecified: Secondary | ICD-10-CM | POA: Diagnosis not present

## 2024-11-17 DIAGNOSIS — F1420 Cocaine dependence, uncomplicated: Secondary | ICD-10-CM | POA: Diagnosis not present

## 2024-11-17 DIAGNOSIS — Z720 Tobacco use: Secondary | ICD-10-CM | POA: Diagnosis not present

## 2024-11-18 DIAGNOSIS — Z5900 Homelessness unspecified: Secondary | ICD-10-CM | POA: Diagnosis not present

## 2024-11-18 DIAGNOSIS — Z720 Tobacco use: Secondary | ICD-10-CM | POA: Diagnosis not present

## 2024-11-18 DIAGNOSIS — F419 Anxiety disorder, unspecified: Secondary | ICD-10-CM | POA: Diagnosis not present

## 2024-11-18 DIAGNOSIS — Z5941 Food insecurity: Secondary | ICD-10-CM | POA: Diagnosis not present

## 2024-11-18 DIAGNOSIS — F1420 Cocaine dependence, uncomplicated: Secondary | ICD-10-CM | POA: Diagnosis not present

## 2024-11-18 DIAGNOSIS — F1020 Alcohol dependence, uncomplicated: Secondary | ICD-10-CM | POA: Diagnosis not present

## 2024-11-19 DIAGNOSIS — F1420 Cocaine dependence, uncomplicated: Secondary | ICD-10-CM | POA: Diagnosis not present

## 2024-11-19 DIAGNOSIS — Z720 Tobacco use: Secondary | ICD-10-CM | POA: Diagnosis not present

## 2024-11-19 DIAGNOSIS — F1020 Alcohol dependence, uncomplicated: Secondary | ICD-10-CM | POA: Diagnosis not present

## 2024-11-19 DIAGNOSIS — Z5941 Food insecurity: Secondary | ICD-10-CM | POA: Diagnosis not present

## 2024-11-19 DIAGNOSIS — F419 Anxiety disorder, unspecified: Secondary | ICD-10-CM | POA: Diagnosis not present

## 2024-11-19 DIAGNOSIS — Z5900 Homelessness unspecified: Secondary | ICD-10-CM | POA: Diagnosis not present

## 2024-11-20 DIAGNOSIS — Z5900 Homelessness unspecified: Secondary | ICD-10-CM | POA: Diagnosis not present

## 2024-11-20 DIAGNOSIS — Z720 Tobacco use: Secondary | ICD-10-CM | POA: Diagnosis not present

## 2024-11-20 DIAGNOSIS — F1420 Cocaine dependence, uncomplicated: Secondary | ICD-10-CM | POA: Diagnosis not present

## 2024-11-20 DIAGNOSIS — F1020 Alcohol dependence, uncomplicated: Secondary | ICD-10-CM | POA: Diagnosis not present

## 2024-11-20 DIAGNOSIS — F419 Anxiety disorder, unspecified: Secondary | ICD-10-CM | POA: Diagnosis not present

## 2024-11-20 DIAGNOSIS — Z5941 Food insecurity: Secondary | ICD-10-CM | POA: Diagnosis not present
